# Patient Record
Sex: Female | Born: 1950
Health system: Southern US, Community
[De-identification: ages and names within clinical notes are randomized; demographics above are authoritative.]

## PROBLEM LIST (undated history)

## (undated) DIAGNOSIS — M8580 Other specified disorders of bone density and structure, unspecified site: Secondary | ICD-10-CM

## (undated) DIAGNOSIS — D649 Anemia, unspecified: Secondary | ICD-10-CM

## (undated) DIAGNOSIS — G4730 Sleep apnea, unspecified: Secondary | ICD-10-CM

## (undated) DIAGNOSIS — M5416 Radiculopathy, lumbar region: Secondary | ICD-10-CM

## (undated) DIAGNOSIS — E669 Obesity, unspecified: Secondary | ICD-10-CM

## (undated) DIAGNOSIS — E7800 Pure hypercholesterolemia, unspecified: Secondary | ICD-10-CM

## (undated) DIAGNOSIS — J189 Pneumonia, unspecified organism: Secondary | ICD-10-CM

## (undated) DIAGNOSIS — T8859XA Other complications of anesthesia, initial encounter: Secondary | ICD-10-CM

## (undated) DIAGNOSIS — K3189 Other diseases of stomach and duodenum: Secondary | ICD-10-CM

## (undated) DIAGNOSIS — C801 Malignant (primary) neoplasm, unspecified: Secondary | ICD-10-CM

## (undated) DIAGNOSIS — E618 Deficiency of other specified nutrient elements: Secondary | ICD-10-CM

## (undated) DIAGNOSIS — G4700 Insomnia, unspecified: Secondary | ICD-10-CM

## (undated) DIAGNOSIS — E039 Hypothyroidism, unspecified: Secondary | ICD-10-CM

## (undated) DIAGNOSIS — R5383 Other fatigue: Secondary | ICD-10-CM

## (undated) DIAGNOSIS — J069 Acute upper respiratory infection, unspecified: Secondary | ICD-10-CM

## (undated) DIAGNOSIS — B3782 Candidal enteritis: Secondary | ICD-10-CM

## (undated) DIAGNOSIS — J40 Bronchitis, not specified as acute or chronic: Secondary | ICD-10-CM

## (undated) DIAGNOSIS — K219 Gastro-esophageal reflux disease without esophagitis: Secondary | ICD-10-CM

## (undated) DIAGNOSIS — T4145XA Adverse effect of unspecified anesthetic, initial encounter: Secondary | ICD-10-CM

## (undated) HISTORY — DX: Other specified disorders of bone density and structure, unspecified site: M85.80

## (undated) HISTORY — PX: OTHER SURGICAL HISTORY: SHX169

## (undated) HISTORY — DX: Other complications of anesthesia, initial encounter: T88.59XA

## (undated) HISTORY — DX: Malignant (primary) neoplasm, unspecified: C80.1

## (undated) HISTORY — PX: COLONOSCOPY: SHX174

## (undated) HISTORY — PX: LUNG BIOPSY: SHX232

## (undated) HISTORY — PX: GALLBLADDER SURGERY: SHX652

## (undated) HISTORY — DX: Radiculopathy, lumbar region: M54.16

## (undated) HISTORY — DX: Gastro-esophageal reflux disease without esophagitis: K21.9

## (undated) HISTORY — DX: Hypothyroidism, unspecified: E03.9

## (undated) HISTORY — PX: TONSILLECTOMY: SUR1361

## (undated) HISTORY — DX: Adverse effect of unspecified anesthetic, initial encounter: T41.45XA

## (undated) HISTORY — DX: Insomnia, unspecified: G47.00

## (undated) HISTORY — PX: LAPAROSCOPIC CHOLECYSTECTOMY: SUR755

## (undated) HISTORY — DX: Other diseases of stomach and duodenum: K31.89

## (undated) HISTORY — DX: Bronchitis, not specified as acute or chronic: J40

## (undated) HISTORY — DX: Pure hypercholesterolemia, unspecified: E78.00

## (undated) HISTORY — DX: Obesity, unspecified: E66.9

## (undated) HISTORY — PX: POLYPECTOMY: SHX149

## (undated) HISTORY — DX: Candidal enteritis: B37.82

## (undated) HISTORY — DX: Other fatigue: R53.83

## (undated) HISTORY — DX: Anemia, unspecified: D64.9

## (undated) HISTORY — DX: Deficiency of other specified nutrient elements: E61.8

---

## 2003-02-22 ENCOUNTER — Other Ambulatory Visit: Admission: RE | Admit: 2003-02-22 | Discharge: 2003-02-22 | Payer: Self-pay | Admitting: Internal Medicine

## 2003-02-26 ENCOUNTER — Encounter: Admission: RE | Admit: 2003-02-26 | Discharge: 2003-02-26 | Payer: Self-pay | Admitting: Internal Medicine

## 2003-02-26 ENCOUNTER — Encounter: Payer: Self-pay | Admitting: Internal Medicine

## 2004-01-10 ENCOUNTER — Encounter: Admission: RE | Admit: 2004-01-10 | Discharge: 2004-01-10 | Payer: Self-pay | Admitting: Family Medicine

## 2004-02-02 ENCOUNTER — Observation Stay (HOSPITAL_COMMUNITY): Admission: RE | Admit: 2004-02-02 | Discharge: 2004-02-02 | Payer: Self-pay | Admitting: *Deleted

## 2004-02-02 ENCOUNTER — Encounter (INDEPENDENT_AMBULATORY_CARE_PROVIDER_SITE_OTHER): Payer: Self-pay | Admitting: Specialist

## 2005-09-16 ENCOUNTER — Ambulatory Visit (HOSPITAL_COMMUNITY): Admission: RE | Admit: 2005-09-16 | Discharge: 2005-09-16 | Payer: Self-pay | Admitting: *Deleted

## 2007-06-09 ENCOUNTER — Encounter: Admission: RE | Admit: 2007-06-09 | Discharge: 2007-06-09 | Payer: Self-pay | Admitting: Internal Medicine

## 2010-10-01 ENCOUNTER — Encounter: Payer: Self-pay | Admitting: Internal Medicine

## 2011-01-26 NOTE — Op Note (Signed)
NAME:  April [name], April [name]                            ACCOUNT NO.:  [id number]   MEDICAL RECORD NO.:  [id number]                   PATIENT TYPE:  AMB   LOCATION:  DAY                                  FACILITY:  [facility]   PHYSICIAN:  Vikki Ports, M.D.         DATE OF BIRTH:  08-18-1951   DATE OF PROCEDURE:  02/02/2004  DATE OF DISCHARGE:                                 OPERATIVE REPORT   PREOPERATIVE DIAGNOSIS:  Symptomatic cholelithiasis.   POSTOPERATIVE DIAGNOSIS:  Acute cholecystitis.   SURGERY:  Laparoscopic cholecystectomy.   SURGEON:  Danna Hefty, M.D.   ASSISTANT:  Jaclynn Guarneri, M.D.   ANESTHESIA:  General.   DESCRIPTION OF PROCEDURE:  The patient was taken to the operating room and  placed in a supine position.  After adequate general anesthesia was induced  using endotracheal tube, the abdomen was prepped and draped in the normal  sterile fashion.  Using a transverse infraumbilical incision, I dissected  down to the fascia.  Fascia was opened vertically.  An 0 Vicryl pursestring  suture was placed around the fascial defect.  Pneumoperitoneum was obtained  after Hasson trocar was placed in the abdomen.  Under direct visualization,  a 10 mm port was placed in the subxiphoid region; two 5 mm ports were placed  in the right abdomen.  Gallbladder was identified, was very tense, and had  significant edema and areas of patchy necrosis.  It was aspirated and then  retracted cephalad.  Dissecting down at the infundibulum near the neck, the  cystic duct was identified.  Good window was created posterior to it.  The  cystic duct was triply clipped and divided.  The cystic artery was  identified, dissected in a similar fashion, triply clipped and divided.  Gallbladder was taken off the gallbladder bed using Bovie electrocautery.  Of note, there was a very significant amount of inflammation and no good  plane.  Surgicel was placed in the gallbladder fossa, and the  gallbladder  was placed in an EndoCatch bag and removed through the umbilical port.  Adequate hemostasis was assured.  Incisions were injected with Marcaine.  The infraumbilical fascial defect was closed with the 0 Vicryl pursestring  suture.  Skin incisions were closed with subcuticular 4-0 Monocryl.  Steri-  Strips and sterile dressings were applied.  The patient tolerated the  procedure well and went to PACU in good condition.                                               Vikki Ports, M.D.    KRH/MEDQ  D:  02/02/2004  T:  02/02/2004  Job:  [id number]

## 2011-08-16 ENCOUNTER — Ambulatory Visit (INDEPENDENT_AMBULATORY_CARE_PROVIDER_SITE_OTHER): Payer: BC Managed Care – PPO

## 2011-08-16 DIAGNOSIS — R0989 Other specified symptoms and signs involving the circulatory and respiratory systems: Secondary | ICD-10-CM

## 2011-08-16 DIAGNOSIS — J40 Bronchitis, not specified as acute or chronic: Secondary | ICD-10-CM

## 2011-08-16 DIAGNOSIS — R05 Cough: Secondary | ICD-10-CM

## 2011-08-21 ENCOUNTER — Other Ambulatory Visit: Payer: Self-pay | Admitting: Internal Medicine

## 2011-08-21 ENCOUNTER — Ambulatory Visit
Admission: RE | Admit: 2011-08-21 | Discharge: 2011-08-21 | Disposition: A | Discharge status: Home / Self Care | Payer: BC Managed Care – PPO | Source: Ambulatory Visit | Attending: Internal Medicine | Admitting: Internal Medicine

## 2011-08-21 DIAGNOSIS — IMO0002 Reserved for concepts with insufficient information to code with codable children: Secondary | ICD-10-CM

## 2011-08-23 ENCOUNTER — Encounter: Payer: Self-pay | Admitting: *Deleted

## 2011-08-24 ENCOUNTER — Encounter: Payer: Self-pay | Admitting: Cardiovascular Disease

## 2011-08-24 ENCOUNTER — Ambulatory Visit (INDEPENDENT_AMBULATORY_CARE_PROVIDER_SITE_OTHER): Payer: BC Managed Care – PPO | Admitting: Cardiovascular Disease

## 2011-08-24 VITALS — BP 103/64 | HR 67 | Wt 181.0 lb

## 2011-08-24 DIAGNOSIS — J9859 Other diseases of mediastinum, not elsewhere classified: Secondary | ICD-10-CM | POA: Insufficient documentation

## 2011-08-24 DIAGNOSIS — R222 Localized swelling, mass and lump, trunk: Secondary | ICD-10-CM

## 2011-08-24 DIAGNOSIS — J069 Acute upper respiratory infection, unspecified: Secondary | ICD-10-CM | POA: Insufficient documentation

## 2011-08-24 DIAGNOSIS — I5189 Other ill-defined heart diseases: Secondary | ICD-10-CM

## 2011-08-24 DIAGNOSIS — E039 Hypothyroidism, unspecified: Secondary | ICD-10-CM

## 2011-08-24 DIAGNOSIS — I517 Cardiomegaly: Secondary | ICD-10-CM

## 2011-08-24 NOTE — Progress Notes (Signed)
60 yo referred by Dr Alessandra Bevels for cardiomegaly.  Seen at [facility] recently for URI/bronchitis  CXR showed ? Cardiomegaly.  I reviewed the actual images on Efilm and reviewed them with Dr Richarda Overlie from [facility] radiology.  Differential is wide but includes pericardial cyst, thymoma/lipoma, primary RML lung lesion, coronary aneurysm.  She is essentially asymptomtic.  Lives with significant life partner who is healthy.  No recent fever, sputum or hemoptysis.  No recent trauma.  Patient cannot recall recent CXR.  She denies SSCP or cardiac history.  On thyroid replacement but no history of large goiter.  CRF;s include elevated lipids on statin RX.    ROS: Denies fever, malais, weight loss, blurry vision, decreased visual acuity, cough, sputum, SOB, hemoptysis, pleuritic pain, palpitaitons, heartburn, abdominal pain, melena, lower extremity edema, claudication, or rash.  All other systems reviewed and negative   General: Affect appropriate Healthy:  appears stated age HEENT: normal Neck supple with no adenopathy JVP normal no bruits no thyromegaly Lungs clear with no wheezing and good diaphragmatic motion Heart:  S1/S2 no murmur,rub, gallop or click PMI normal Abdomen: benighn, BS positve, no tenderness, no AAA no bruit.  No HSM or HJR Distal pulses intact with no bruits No edema Neuro non-focal Skin warm and dry No muscular weakness  Medications Current Outpatient Prescriptions  Medication Sig Dispense Refill  . Ascorbic Acid (VITAMIN C) 1000 MG tablet Take 1,000 mg by mouth daily.        . benzonatate (TESSALON) 100 MG capsule Take 100 mg by mouth 3 (three) times daily as needed.        Marland Kitchen BETAINE PO Take 1 tablet by mouth daily.        . Cholecalciferol (VITAMIN D3) 5000 UNITS CAPS Take 1 tablet by mouth daily.        . IODINE, KELP, PO Take 1 tablet by mouth 2 (two) times daily.        Marland Kitchen liothyronine (CYTOMEL) 5 MCG tablet 2 tabs po am and 1 tab po qhs       . MILK  THISTLE PO Take 1 tablet by mouth daily.        . Multiple Vitamins-Minerals (ZINC PO) Take 1 tablet by mouth daily.        . NON FORMULARY Ultra nutrient 2-3 pills qd       . NON FORMULARY AnnattoTocotrienols 1 tab po qhs       . NON FORMULARY Primary Digest Pills 1 tab with each meal       . pravastatin (PRAVACHOL) 40 MG tablet Take 40 mg by mouth daily.        . Selenium 200 MCG CAPS Take 1 capsule by mouth daily.        . Taurine 1000 MG CAPS Take 2 capsules by mouth daily.        Marland Kitchen thyroid (ARMOUR) 60 MG tablet 1 1/2 tab po qd       . vitamin A 16109 UNIT capsule Take 25,000 Units by mouth daily.        . NON FORMULARY Eye drops daily       . PHOSPHATIDYLCHOLINE PO Take 1 tablet by mouth daily.          Allergies NKDA.  Family History: Negative for premature CAD  Social History: History   Social History  . Marital Status: Single    Spouse Name: N/A    Number of Children: N/A  . Years of Education:  N/A   Lives with significant life partner.  Exercises regularly including "intense" weights 2x/week Nonsmoker nondrinker    Electrocardiogram:  NSR normal ECG done at urgent care CXR:  Right paracardiac mass.  Anterior on lateral view.  Differential includes pericardial cyst, thymoma/lipoma primary RML lung lesion  Assessment and Plan

## 2011-08-24 NOTE — Assessment & Plan Note (Signed)
Continue replacement Rx  TSH/Free T4 with Dr Alessandra Bevels

## 2011-08-24 NOTE — Patient Instructions (Signed)
Your physician has requested that you have an echocardiogram. Echocardiography is a painless test that uses sound waves to create images of your heart. It provides your doctor with information about the size and shape of your heart and how well your heart's chambers and valves are working. This procedure takes approximately one hour. There are no restrictions for this procedure.   

## 2011-08-24 NOTE — Assessment & Plan Note (Signed)
Resolved.  Normal exam today.  CXR finding not likely related to initial clincial presentation

## 2011-08-24 NOTE — Assessment & Plan Note (Signed)
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  Given patients overall general excellent health would be unlikely to be pathologic finding.  Start with echo.  However I suspect she will need chest CT with contrast or MRI for larger field of view.

## 2011-08-27 ENCOUNTER — Ambulatory Visit (HOSPITAL_COMMUNITY): Payer: BC Managed Care – PPO | Attending: Cardiovascular Disease | Admitting: Radiology

## 2011-08-27 DIAGNOSIS — I079 Rheumatic tricuspid valve disease, unspecified: Secondary | ICD-10-CM | POA: Insufficient documentation

## 2011-08-27 DIAGNOSIS — I517 Cardiomegaly: Secondary | ICD-10-CM | POA: Insufficient documentation

## 2011-08-27 DIAGNOSIS — R222 Localized swelling, mass and lump, trunk: Secondary | ICD-10-CM | POA: Insufficient documentation

## 2011-08-28 ENCOUNTER — Telehealth: Payer: Self-pay | Admitting: Cardiovascular Disease

## 2011-08-28 DIAGNOSIS — I318 Other specified diseases of pericardium: Secondary | ICD-10-CM

## 2011-08-28 NOTE — Telephone Encounter (Signed)
Pt calling re echo results. °

## 2011-08-28 NOTE — Telephone Encounter (Signed)
PT AWARE  OF ECHO RESULTS PER DR [facility] NEEDS CARDIAC CT  R/O PERICARDIAL  MASS VERSES CYST./CY

## 2011-08-31 ENCOUNTER — Ambulatory Visit (INDEPENDENT_AMBULATORY_CARE_PROVIDER_SITE_OTHER)
Admission: RE | Admit: 2011-08-31 | Discharge: 2011-08-31 | Disposition: A | Discharge status: Home / Self Care | Payer: BC Managed Care – PPO | Source: Ambulatory Visit | Attending: Cardiovascular Disease | Admitting: Cardiovascular Disease

## 2011-08-31 ENCOUNTER — Other Ambulatory Visit: Payer: Self-pay | Admitting: *Deleted

## 2011-08-31 DIAGNOSIS — R222 Localized swelling, mass and lump, trunk: Secondary | ICD-10-CM

## 2011-08-31 MED ORDER — IOHEXOL 300 MG/ML  SOLN
80.0000 mL | Freq: Once | INTRAMUSCULAR | Status: AC | PRN
  Administered 2011-08-31: 80 mL via INTRAVENOUS

## 2011-08-31 NOTE — Telephone Encounter (Signed)
Addended by: Scherrie Bateman E on: 08/31/2011 03:08 PM   Modules accepted: Orders

## 2011-08-31 NOTE — Telephone Encounter (Signed)
PT NEEDS CHEST CT NOT CARDIAC CT  ORDER ENTERED./CY

## 2011-09-13 ENCOUNTER — Telehealth: Payer: Self-pay | Admitting: Cardiovascular Disease

## 2011-09-13 ENCOUNTER — Ambulatory Visit (INDEPENDENT_AMBULATORY_CARE_PROVIDER_SITE_OTHER): Payer: BC Managed Care – PPO | Admitting: *Deleted

## 2011-09-13 DIAGNOSIS — Z01810 Encounter for preprocedural cardiovascular examination: Secondary | ICD-10-CM

## 2011-09-13 DIAGNOSIS — R222 Localized swelling, mass and lump, trunk: Secondary | ICD-10-CM

## 2011-09-13 LAB — BASIC METABOLIC PANEL
Calcium: 9.5 mg/dL (ref 8.4–10.5)
Creatinine, Ser: 0.8 mg/dL (ref 0.4–1.2)
GFR: 79.5 mL/min (ref >60.00)
Sodium: 140 mEq/L (ref 135–145)

## 2011-09-13 NOTE — Telephone Encounter (Signed)
FU Call: Pt returning call from Christine. Please return pt call to discuss further.  

## 2011-09-13 NOTE — Telephone Encounter (Signed)
Pt would like to know if Dr. Eden Emms has gotten the x-rays she has requested to be sent to him it should be two of them the back and the side

## 2011-09-13 NOTE — Telephone Encounter (Signed)
PT AWARE  WILL SCHEDULE  CHEST MRI  WITH DR DAN ENTRIKEN WILL HAVE BMET DONE TODAY ./CY

## 2011-09-14 NOTE — Telephone Encounter (Signed)
PT AWARE DID NOT FIND  CD OF CXR FROM 2-12 OR 11-12 the patient  TO REQUEST ANOTHER COPY FROM  OTHER FACILITY PER DR NISHAN'S REQUEST .Zack Seal

## 2011-09-17 ENCOUNTER — Other Ambulatory Visit: Payer: Self-pay | Admitting: Cardiovascular Disease

## 2011-09-17 ENCOUNTER — Telehealth: Payer: Self-pay | Admitting: Cardiovascular Disease

## 2011-09-17 ENCOUNTER — Telehealth: Payer: Self-pay | Admitting: *Deleted

## 2011-09-17 DIAGNOSIS — J9859 Other diseases of mediastinum, not elsewhere classified: Secondary | ICD-10-CM

## 2011-09-17 NOTE — Telephone Encounter (Signed)
Fu call Patient returning your call please call her cell back

## 2011-09-17 NOTE — Telephone Encounter (Signed)
LEFT MESSAGE FOR PT TO CALL BACK  RE  MRI/ MRA SCHEDULED FOR 09-19-10 AT 10:00  AM PT TO ARRIVE IN RADIOLOGY AT  9:45

## 2011-09-17 NOTE — Telephone Encounter (Signed)
PT AWARE OF  TIME OF MRI   AND DATE .April [name]

## 2011-09-17 NOTE — Telephone Encounter (Signed)
Walk in Pt Form " Pt Dropped off CD of Xrays from [facility] for Nishan to review" sent to [facility]  09/17/11/KM

## 2011-09-20 ENCOUNTER — Other Ambulatory Visit (HOSPITAL_COMMUNITY): Payer: Self-pay | Admitting: Interventional Radiology

## 2011-09-20 ENCOUNTER — Other Ambulatory Visit (HOSPITAL_COMMUNITY): Payer: BC Managed Care – PPO

## 2011-09-20 ENCOUNTER — Telehealth: Payer: Self-pay | Admitting: Cardiovascular Disease

## 2011-09-20 DIAGNOSIS — J9859 Other diseases of mediastinum, not elsewhere classified: Secondary | ICD-10-CM

## 2011-09-20 NOTE — Telephone Encounter (Signed)
FU Call: pt calling with question about biopsy. Please return pt call to discuss further.

## 2011-09-20 NOTE — Telephone Encounter (Signed)
Patient called, she wants to know in advance if it will be something that could keep her from going to [facility] for an event after a biopsy of a tumor  in her chest that will be done  a week after next Friday.  Patient was made aware that she can consult with the MD that is performing the biopsy, and that it is  difficult to predict what would happened prior the procedure. Patient states " I don't want your opinion,I want Dr. Fabio Bering " I need to know in advance what could happened, so my family and I can reschedule the event for later.

## 2011-09-20 NOTE — Telephone Encounter (Signed)
Left a message to call back.

## 2011-09-20 NOTE — Telephone Encounter (Signed)
New Problem  Patient would like a return call regarding her care.  Please return call to Cell

## 2011-09-21 ENCOUNTER — Encounter (HOSPITAL_COMMUNITY): Payer: Self-pay

## 2011-09-21 ENCOUNTER — Other Ambulatory Visit: Payer: Self-pay | Admitting: Radiology

## 2011-09-24 NOTE — Telephone Encounter (Signed)
DR Eden Emms SPOKE TO PT./CY

## 2011-09-26 ENCOUNTER — Ambulatory Visit
Admission: RE | Admit: 2011-09-26 | Discharge: 2011-09-26 | Disposition: A | Discharge status: Home / Self Care | Payer: BC Managed Care – PPO | Source: Ambulatory Visit | Attending: Cardiovascular Disease | Admitting: Cardiovascular Disease

## 2011-09-26 DIAGNOSIS — J9859 Other diseases of mediastinum, not elsewhere classified: Secondary | ICD-10-CM

## 2011-09-27 ENCOUNTER — Other Ambulatory Visit: Payer: Self-pay | Admitting: Radiology

## 2011-09-28 ENCOUNTER — Ambulatory Visit (HOSPITAL_COMMUNITY)
Admission: RE | Admit: 2011-09-28 | Discharge: 2011-09-28 | Disposition: A | Discharge status: Home / Self Care | Payer: BC Managed Care – PPO | Source: Ambulatory Visit | Attending: Interventional Radiology | Admitting: Interventional Radiology

## 2011-09-28 ENCOUNTER — Encounter (HOSPITAL_COMMUNITY): Payer: Self-pay

## 2011-09-28 DIAGNOSIS — R222 Localized swelling, mass and lump, trunk: Secondary | ICD-10-CM | POA: Insufficient documentation

## 2011-09-28 DIAGNOSIS — R05 Cough: Secondary | ICD-10-CM | POA: Insufficient documentation

## 2011-09-28 DIAGNOSIS — Z79899 Other long term (current) drug therapy: Secondary | ICD-10-CM | POA: Insufficient documentation

## 2011-09-28 DIAGNOSIS — R059 Cough, unspecified: Secondary | ICD-10-CM | POA: Insufficient documentation

## 2011-09-28 DIAGNOSIS — E039 Hypothyroidism, unspecified: Secondary | ICD-10-CM | POA: Insufficient documentation

## 2011-09-28 DIAGNOSIS — J3489 Other specified disorders of nose and nasal sinuses: Secondary | ICD-10-CM | POA: Insufficient documentation

## 2011-09-28 DIAGNOSIS — J9859 Other diseases of mediastinum, not elsewhere classified: Secondary | ICD-10-CM

## 2011-09-28 DIAGNOSIS — D4989 Neoplasm of unspecified behavior of other specified sites: Secondary | ICD-10-CM | POA: Insufficient documentation

## 2011-09-28 DIAGNOSIS — E7800 Pure hypercholesterolemia, unspecified: Secondary | ICD-10-CM | POA: Insufficient documentation

## 2011-09-28 LAB — PROTIME-INR
INR: 1.04 (ref 0.00–1.49)
Prothrombin Time: 13.8 seconds (ref 11.6–15.2)

## 2011-09-28 LAB — CBC
HCT: 38.8 % (ref 36.0–46.0)
MCHC: 34.5 g/dL (ref 30.0–36.0)
RDW: 13.4 % (ref 11.5–15.5)

## 2011-09-28 LAB — APTT: aPTT: 36 seconds (ref 24–37)

## 2011-09-28 MED ORDER — FENTANYL CITRATE 0.05 MG/ML IJ SOLN
INTRAMUSCULAR | Status: AC | PRN
  Administered 2011-09-28: 50 ug via INTRAVENOUS

## 2011-09-28 MED ORDER — SODIUM CHLORIDE 0.9 % IV SOLN
INTRAVENOUS | Status: DC
  Administered 2011-09-28: 500 mL via INTRAVENOUS

## 2011-09-28 MED ORDER — MIDAZOLAM HCL 5 MG/5ML IJ SOLN
INTRAMUSCULAR | Status: AC | PRN
  Administered 2011-09-28: 1 mg via INTRAVENOUS

## 2011-09-28 NOTE — H&P (Signed)
Patient seen and examined on 09/27/11 in office.  CT guided biopsy of mediastinal mass today.

## 2011-09-28 NOTE — Procedures (Signed)
Procedure:  CT guided biopsy of mediastinal mass Findings:  18G core biopsy x 4 via 17G needle of right anterior mediastinal mass. No complications

## 2011-09-28 NOTE — H&P (Signed)
April [name] is an 61 y.o. female.   Chief Complaint: "I'm here for a biopsy" HPI: Patient with history of upper respiratory tract infection and recent imaging studies revealing a 6.5 cm solid anterior mediastinal mass; she presents today for CT guided biopsy of the mediastinal mass.  Past Medical History  Diagnosis Date  . Obesity   . Hypothyroidism   . Insomnia   . Mineral deficiency   . Hypochlorhydria   . Hypercholesteremia   . Cataract   . Vitamin D deficiency   . Cardiomegaly   . Fatigue   . Candidiasis of intestine   . Vitamin D deficiency   . Radiculopathy, lumbar region   . Bronchitis     Past Surgical History  Procedure Date  . Laparoscopic cholecystectomy   . Gallbladder surgery   . Tonsillectomy     History reviewed. No pertinent family history. Social History:  reports that she quit smoking about 28 years ago. Her smoking use included Cigarettes. She has a 17 pack-year smoking history. She does not have any smokeless tobacco history on file. She reports that she does not drink alcohol or use illicit drugs.  Allergies: No Known Allergies  Medications Prior to Admission  Medication Sig Dispense Refill  . Acetylcysteine (N-ACETYL-L-CYSTEINE PO) Take 1 tablet by mouth daily.      . Ascorbic Acid (VITAMIN C) 1000 MG tablet Take 1,000 mg by mouth daily.        Marland Kitchen BETAINE PO Take 3 tablets by mouth 3 (three) times daily with meals.       . Cholecalciferol (VITAMIN D3) 5000 UNITS CAPS Take 1 tablet by mouth daily.       . Coenzyme Q10 (COQ10 PO) Take 1 capsule by mouth daily.      . Iodine Strong, Lugols, (IODINE STRONG PO) Take 2 capsules by mouth 2 (two) times daily.      Marland Kitchen MILK THISTLE PO Take 1 capsule by mouth daily.       . Multiple Vitamins-Minerals (ZINC PO) Take 1 capsule by mouth daily.       . NON FORMULARY Take 2 capsules by mouth 3 (three) times daily with meals. Ultra nutrient supplement       . NON FORMULARY Take 1 capsule by mouth every evening.  Annatto Tocotrienols Supplement        . NON FORMULARY Take 1 tablet by mouth 3 (three) times daily with meals. Primary Digest Supplement.       Marland Kitchen OVER THE COUNTER MEDICATION Take 1 capsule by mouth 3 (three) times daily with meals. Digestion GB.      Marland Kitchen OVER THE COUNTER MEDICATION Take 1 tablet by mouth 2 (two) times daily. hemagenics iron supplement.      . pravastatin (PRAVACHOL) 40 MG tablet Take 40 mg by mouth every evening.       . Selenium 200 MCG CAPS Take 1 capsule by mouth daily.       . Taurine 1000 MG CAPS Take 1 capsule by mouth daily.       . vitamin A 16109 UNIT capsule Take 25,000 Units by mouth daily.       Marland Kitchen liothyronine (CYTOMEL) 5 MCG tablet Take 5-10 mcg by mouth 2 (two) times daily. 2 tablets every morning and 1 tablet in the afternoon.      . thyroid (ARMOUR) 60 MG tablet Take 90 mg by mouth daily before breakfast.        Medications Prior to Admission  Medication  Dose Route Frequency Provider Last Rate Last Dose  . 0.9 %  sodium chloride infusion   Intravenous Continuous Robet Leu, PA 20 mL/hr at 09/28/11 0730 500 mL at 09/28/11 0730    Results for orders placed during the hospital encounter of 09/28/11 (from the past 48 hour(s))  APTT     Status: Normal   Collection Time   09/28/11  7:35 AM      Component Value Range Comment   aPTT 36  24 - 37 (seconds)   CBC     Status: Normal   Collection Time   09/28/11  7:35 AM      Component Value Range Comment   WBC 6.3  4.0 - 10.5 (K/uL)    RBC 4.71  3.87 - 5.11 (MIL/uL)    Hemoglobin 13.4  12.0 - 15.0 (g/dL)    HCT 16.1  09.6 - 04.5 (%)    MCV 82.4  78.0 - 100.0 (fL)    MCH 28.5  26.0 - 34.0 (pg)    MCHC 34.5  30.0 - 36.0 (g/dL)    RDW 40.9  81.1 - 91.4 (%)    Platelets 202  150 - 400 (K/uL)   PROTIME-INR     Status: Normal   Collection Time   09/28/11  7:35 AM      Component Value Range Comment   Prothrombin Time 13.8  11.6 - 15.2 (seconds)    INR 1.04  0.00 - 1.49     Ir Radiologist Eval &  Mgmt  09/26/2011  *RADIOLOGY REPORT*  NEW PATIENT OFFICE VISIT - LEVEL II ([id number])  Chief Complaint:  Anterior mediastinal mass.  History:  April [name] has been referred by Dr. Maurine Cane for a CT guided biopsy of a recently discovered anterior mediastinal mass. This has been scheduled to be performed under CT guidance at [facility] on 09/27/2010.  Dr. Eden Emms and April [name] requested an office visit beforehand to discuss the biopsy procedure.  The patient is a 60-year female who had a chest x-ray in December through an [facility] for evaluation of an upper respiratory tract infection.  This revealed a mediastinal contour abnormality which was further assessed by CT of the chest on 08/31/2011.  This demonstrated a 6.5 cm solid anterior mediastinal mass to the right of midline.  No other associated masses or adenopathy identified in the chest.  There was note made of a small hypervascular nodular lesion in the visualized posterior right lobe of the liver.  The patient's primary care physician is Dr. Mia Creek.  The patient has had a recent upper respiratory cold with some cough and congestion for which she is taking a cough suppressent and over- the-counter medicine.  She denies any chest pain.  She does not have a history of malignancy.  She has had no thoracic surgical procedures.  Assessment and Plan:  I met with April [name] and her partner April [name] in a strictly consultative/counseling role.  We reviewed CT findings and discussed details of percutaneous biopsy including technical details and risks.  The mass is approachable from a right parasternal approach without having to traverse lung tissue.  April [name] had appropriate questions regarding the procedure as well as potential diagnoses.  All questions were answered.  The procedure is performed under IV conscious sedation.  Typical recovery is 3 hours of bedrest after the procedure.  April [name] would like to proceed with the  procedure.  Original Report Authenticated By: Reola Calkins,  M.D.    Review of Systems  Constitutional: Negative for fever and chills.  HENT: Positive for congestion.   Respiratory: Positive for cough. Negative for shortness of breath.   Cardiovascular: Negative for chest pain.  Gastrointestinal: Negative for nausea, vomiting and abdominal pain.  Neurological: Negative for headaches.  Endo/Heme/Allergies: Does not bruise/bleed easily.    Blood pressure 114/79, pulse 81, temperature 97.5 F (36.4 C), temperature source Oral, resp. rate 18, height 5\' 4"  (1.626 m), weight 180 lb (81.647 kg), SpO2 95.00%. Physical Exam  Constitutional: She is oriented to person, place, and time. She appears well-developed and well-nourished.  Cardiovascular: Normal rate and regular rhythm.   Respiratory: Effort normal.       Few scattered rhonchi  GI: Soft. Bowel sounds are normal. There is no tenderness.  Musculoskeletal: Normal range of motion.  Neurological: She is alert and oriented to person, place, and time.     Assessment/Plan Patient with 6.5 cm anterior mediastinal mass; plan is for CT guided biopsy.  ALLRED,D KEVIN 09/28/2011, 9:04 AM

## 2011-10-03 ENCOUNTER — Telehealth: Payer: Self-pay | Admitting: Cardiovascular Disease

## 2011-10-03 NOTE — Telephone Encounter (Signed)
PT STATED NEEDS ANOTHER REFERRAL  PER RADIOLOGIST, BASED ON BIOPSY REPORT . PT WOULD LIKE TO TALK TO YOU NO LATER THAN TOMORROW   IS GOING OUT OF TOWN  CELL NUMBER IS [phone] PT AWARE  WILL FORWARD TO DR NISHAN./CY

## 2011-10-03 NOTE — Telephone Encounter (Signed)
Pt had biopsy on Friday and she got report and she wants to talk about ref to another provider as a results of the results

## 2011-10-04 NOTE — Telephone Encounter (Signed)
Ed,  This his her cell number.  Hopefully you can see her on 2/7

## 2011-10-15 ENCOUNTER — Other Ambulatory Visit: Payer: Self-pay | Admitting: *Deleted

## 2011-10-15 DIAGNOSIS — I5189 Other ill-defined heart diseases: Secondary | ICD-10-CM

## 2011-10-15 NOTE — Telephone Encounter (Signed)
Entered referral to Dr. Tyrone Sage.

## 2011-10-15 NOTE — Telephone Encounter (Signed)
New Msg: Pt calling wanting referral to [facility], Dr. Tyrone Sage. Please return pt call to discuss further.

## 2011-10-16 ENCOUNTER — Other Ambulatory Visit: Payer: Self-pay | Admitting: Cardiothoracic Surgery

## 2011-10-16 DIAGNOSIS — D381 Neoplasm of uncertain behavior of trachea, bronchus and lung: Secondary | ICD-10-CM

## 2011-10-19 ENCOUNTER — Encounter: Payer: BC Managed Care – PPO | Admitting: Cardiothoracic Surgery

## 2011-10-19 ENCOUNTER — Ambulatory Visit (HOSPITAL_COMMUNITY)
Admission: RE | Admit: 2011-10-19 | Discharge: 2011-10-19 | Disposition: A | Discharge status: Home / Self Care | Payer: BC Managed Care – PPO | Source: Ambulatory Visit | Attending: Cardiothoracic Surgery | Admitting: Cardiothoracic Surgery

## 2011-10-19 ENCOUNTER — Institutional Professional Consult (permissible substitution) (INDEPENDENT_AMBULATORY_CARE_PROVIDER_SITE_OTHER): Payer: BC Managed Care – PPO | Admitting: Cardiothoracic Surgery

## 2011-10-19 ENCOUNTER — Encounter: Payer: Self-pay | Admitting: Cardiothoracic Surgery

## 2011-10-19 VITALS — BP 129/79 | HR 67 | Resp 20 | Ht 64.0 in | Wt 178.0 lb

## 2011-10-19 DIAGNOSIS — R222 Localized swelling, mass and lump, trunk: Secondary | ICD-10-CM

## 2011-10-19 DIAGNOSIS — D381 Neoplasm of uncertain behavior of trachea, bronchus and lung: Secondary | ICD-10-CM | POA: Insufficient documentation

## 2011-10-21 NOTE — Progress Notes (Signed)
[address]            [place] [address]          [phone]      April [name] [facility] Medical Record #[id number] Date of Birth: [birth year]  Referring: Wendall Stade, MD Primary Care: Mia Creek, MD, MD  Chief Complaint:    Chief Complaint  Patient presents with  . Mediastinal Mass    Referral from Dr Eden Emms for surgical eval on Mediastinal mass    History of Present Illness:     61 yo female with not history of neurological problems who is found on chest xray to mediastinal mass. CT done in Dec 2012 confirmed anterior mediastinal mass. Needle bx has been done and patient referred for surgical consult. She has no symptoms of muscle weakness.    Current Activity/ Functional Status: Patient Patient is independent with mobility/ambulation, transfers, ADL's, IADL's. independent with mobility/ambulation, transfers, ADL's, IADL's.   Past Medical History  Diagnosis Date  . Obesity   . Hypothyroidism   . Insomnia   . Mineral deficiency   . Hypochlorhydria   . Hypercholesteremia   . Cataract   . Vitamin d deficiency   . Cardiomegaly   . Fatigue   . Candidiasis of intestine   . Vitamin d deficiency   . Radiculopathy, lumbar region   . Bronchitis     Past Surgical History  Procedure Date  . Laparoscopic cholecystectomy   . Gallbladder surgery   . Tonsillectomy     Family History: no family history of mediastinal mass  History   Social History  . Marital Status: [name] partner April [name]     Spouse Name: N/A    Number of Children: N/A   . Years of Education: N/A   Occupational History  . Not on file.   Social History Main Topics  . Smoking status: Former Smoker -- 1.0 packs/day for 17 years    Types: Cigarettes    Quit date: 09/26/1983  . Smokeless tobacco: Not on file  . Alcohol Use: No  . Drug Use: No  . Sexually Active: Not on file   Other Topics Concern  . Not on file   Social History  Narrative  . No narrative on file    History  Smoking status  . Former Smoker -- 1.0 packs/day for 17 years  . Types: Cigarettes  . Quit date: 09/26/1983  Smokeless tobacco  . Not on file    History  Alcohol Use No     No Known Allergies  Current Outpatient Prescriptions  Medication Sig Dispense Refill  . Acetylcysteine (N-ACETYL-L-CYSTEINE PO) Take 1 tablet by mouth daily.      . Ascorbic Acid (VITAMIN C) 1000 MG tablet Take 1,000 mg by mouth daily.        Marland Kitchen BETAINE PO Take 3 tablets by mouth 3 (three) times daily with meals.       . Cholecalciferol (VITAMIN D3) 5000 UNITS CAPS Take 1 tablet by mouth daily.       . Coenzyme Q10 (COQ10 PO) Take 1 capsule by mouth daily.      . Iodine Strong, Lugols, (IODINE STRONG PO) Take 2 capsules by mouth 2 (two) times daily.      Marland Kitchen liothyronine (CYTOMEL) 5 MCG tablet Take 5-10 mcg by mouth 2 (two) times daily. 2 tablets every morning and 1 tablet  in the afternoon.      Marland Kitchen MILK THISTLE PO Take 1 capsule by mouth daily.       . Multiple Vitamins-Minerals (ZINC PO) Take 1 capsule by mouth daily.       . NON FORMULARY Take 2 capsules by mouth 3 (three) times daily with meals. Ultra nutrient supplement       . NON FORMULARY Take 1 capsule by mouth every evening. Annatto Tocotrienols Supplement        . NON FORMULARY Take 1 tablet by mouth 3 (three) times daily with meals. Primary Digest Supplement.       Marland Kitchen OVER THE COUNTER MEDICATION Take 1 tablet by mouth 2 (two) times daily. hemagenics iron supplement.      . pravastatin (PRAVACHOL) 40 MG tablet Take 40 mg by mouth every evening.       . Selenium 200 MCG CAPS Take 1 capsule by mouth daily.       . Taurine 1000 MG CAPS Take 1 capsule by mouth daily.       Marland Kitchen thyroid (ARMOUR) 60 MG tablet Take 90 mg by mouth daily before breakfast.       . vitamin A 91478 UNIT capsule Take 25,000 Units by mouth daily.       Marland Kitchen OVER THE COUNTER MEDICATION Take 1 capsule by mouth 3 (three) times daily with  meals. Digestion GB.           Review of Systems:     Cardiac Review of Systems: Y or N  Chest Pain [  n  ]  Resting SOB [ nn  ] Exertional SOB  [ n ]  Orthopnea [  n]   Pedal Edema [  n ]    Palpitations [n  ] Syncope  [n  ]   Presyncope [ n  ]  General Review of Systems: [Y] = yes [  ]=no Consitutional: recent weight change [ y ]; anorexia [ n ]; fatigue [ y ]; nausea [n  ]; night sweats [ n ]; fever [ n ]; or chills [n  ];                                                                                                                                          Dental: poor dentition[n  ];   Eye : blurred vision [  ]; diplopia [   ]; vision changes [  ];  Amaurosis fugax[  ]; Resp: cough [  ];  wheezing[  ];  hemoptysis[  ]; shortness of breath[  ]; paroxysmal nocturnal dyspnea[  ]; dyspnea on exertion[  ]; or orthopnea[  ];  GI:  gallstones[  ], vomiting[  ];  dysphagia[  ]; melena[  ];  hematochezia [  ]; heartburn[  ];   Hx of  Colonoscopy[  ]; GU: kidney stones [  ]; hematuria[  ];  dysuria [  ];  nocturia[  ];  history of     obstruction [  ];             Skin: rash, swelling[  ];, hair loss[  ];  peripheral edema[  ];  or itching[  ]; Musculoskeletal: myalgias[  ];  joint swelling[  ];  joint erythema[  ];  joint pain[  ];  back pain[  ];  Heme/Lymph: bruising[  ];  bleeding[  ];  anemia[  ];  Neuro: TIA[  ];  headaches[  ];  stroke[  ];  vertigo[  ];  seizures[  ];   paresthesias[  ];  difficulty walking[  ];  Psych:depression[  ]; anxiety[  ];  Endocrine: diabetes[  ];  thyroid dysfunction[  ];  Immunizations: Flu Cove.Etienne  ]; Pneumococcal[ n ];  Other:  Physical Exam: BP 129/79  Pulse 67  Resp 20  Ht 5\' 4"  (1.626 m)  Wt 178 lb (80.74 kg)  BMI 30.55 kg/m2  SpO2 97%  General appearance: alert, cooperative, appears stated age and no distress Neurologic: intact Heart: regular rate and rhythm, S1, S2 normal, no murmur, click, rub or gallop Lungs: clear to auscultation  bilaterally Abdomen: soft, non-tender; bowel sounds normal; no masses,  no organomegaly Extremities: extremities normal, atraumatic, no cyanosis or edema and Homans sign is negative, no sign of DVT no cervical adenopathy palapble   Diagnostic Studies & Laboratory data:     Recent Radiology Findings:  Ct Biopsy  09/28/2011  *RADIOLOGY REPORT*  Clinical Data: Right anterior mediastinal mass.  CT GUIDED CORE BIOPSY OF MEDIASTINAL MASS  Sedation:   1.0 mg IV Versed;  50 mcg IV Fentanyl  Total Moderate Sedation Time: 10 minutes.  Procedure:  The procedure risks, benefits, and alternatives were explained to the patient.  Questions regarding the procedure were encouraged and answered.  The patient understands and consents to the procedure.  The right parasternal region was prepped with Betadine in a sterile fashion, and a sterile drape was applied covering the operative field.  A sterile gown and sterile gloves were used for the procedure.  Local anesthesia was provided with 1% Lidocaine.  Initial unenhanced CT was performed in a supine position.  After choosing a site for needle entry, a 17 gauge needle was advanced under CT guidance to the level of the mediastinal mass from a right parasternal approach.  After confirming needle tip positioning, coaxial core biopsy samples were obtained with an 18 gauge needle device. A total of four intact core biopsy specimens were submitted in saline to Pathology.  Complications: None  Findings: Initial imaging again demonstrates a large right-sided anterior mediastinal mass.  Maximal tumor diameter is 7.2 cm. Solid core biopsy samples were obtained from the tumor.  Material was submitted in saline to facilitate any potential need for flow cytometry.  IMPRESSION: CT guided core biopsy performed of the right anterior mediastinal mass.  Original Report Authenticated By: Reola Calkins, M.D.     RADIOLOGY REPORT*  Clinical Data: Abnormal chest x-ray  CT CHEST WITH  CONTRAST  Technique: Multidetector CT imaging of the chest was performed  following the standard protocol during bolus administration of  intravenous contrast. Sagittal and coronal MPR images reconstructed  from axial data set.  Contrast: 80mL OMNIPAQUE IOHEXOL 300 MG/ML IV SOLN  Comparison: None  Findings:  Vascular structures patent on non dedicated exam.  Minimal scattered atherosclerotic calcification aorta.  Large mass identified right lateral aspect of the anterior  mediastinum adjacent to the aorta, SVC and superior margin of right  atrium, measuring 6.5 x 4.7 x 5.9 cm in size. No definite invasion  of the adjacent structures is identified, though not excluded.  On axial images there is a thin rim of peripheral low attenuation  at the antero-right lateral aspect of this lesion which could  represent fluid.  No additional associated thoracic adenopathy.  Small enhancing nodule at posterior aspect right lobe liver 1.4 x  1.3 cm image 59.  Remaining visualized portions of upper abdomen normal appearance.  Lungs clear.  No pleural effusion or pneumothorax.  Subtle lytic lesion within T9 vertebral body, nonspecific, could  potentially represent a small hemangioma.  IMPRESSION:  Large right anterior mediastinal mass superior anterior and lateral  to the right atrium, 6.5 x 4.7 x 5.9 cm in size.  Differential diagnosis includes thymic tumor, lymphoma, complicated  cyst of thymic or pericardial origin, less likely teratoma.  Recommend MR imaging of the chest with/without contrast to  characterize.  Small hypervascular nodule at posterior aspect right lobe liver,  1.4 x 1.3 cm in size.  This too requires follow-up imaging to determine significance; this  is likely too far inferior to be included within field of view at  time of thoracic MR imaging.  Potentially this could represent an atypical hepatic hemangioma;  may evaluate this further by MR imaging or potentially  sonography.   Recent Lab Findings: Lab Results  Component Value Date   WBC 6.3 09/28/2011   HGB 13.4 09/28/2011   HCT 38.8 09/28/2011   PLT 202 09/28/2011   GLUCOSE 108* 09/13/2011   NA 140 09/13/2011   K 5.1 09/13/2011   CL 103 09/13/2011   CREATININE 0.8 09/13/2011   BUN 16 09/13/2011   CO2 30 09/13/2011   INR 1.04 09/28/2011   PFTS completed   Assessment / Plan:    #1 Large Mediastinal Mass anterior consistent with Thymoma  #2 1.4 hypervascular mass rt lobe of liver   I have reviewed diagnosis with patient and partner [facility]. We reviewed CT and path findings findings and discussed details of surgical resection via partial or full sternotomy.Resection has been recommended. The potential need for follow up radiation treatment is also discussed depending on operative findings is also discussed.  The goals risks and alternatives of the planned surgical procedure Resection of mediastinal mass via sternontomy have been discussed with the patient in detail. The risks of the procedure including death, infection, stroke, phrenic nerve injury, myocardial infarction, bleeding, blood transfusion have all been discussed specifically.  I have quoted April [name] a 1 % of perioperative mortality and a complication rate as high as 10 %. The patient's questions have been answered.April [name] is willing  to proceed with the planned procedure.  Plan to proceed Feb 20      Delight Ovens MD  Beeper [phone] Office [phone] 10/19/2011

## 2011-10-22 ENCOUNTER — Other Ambulatory Visit: Payer: Self-pay

## 2011-10-22 DIAGNOSIS — R222 Localized swelling, mass and lump, trunk: Secondary | ICD-10-CM

## 2011-10-27 NOTE — Pre-Procedure Instructions (Signed)
20 April [name]  10/27/2011     Your procedure is scheduled on: Wednesday, Feb. 20th    Report to [facility] at 6:30 AM.   Call this number if you have problems the morning of surgery: [phone]   Remember:   Do not eat food:After Midnight Tuesday.   May have clear liquids: up to 4 Hours before arrival time - 2:30AM.  Clear liquids include soda, tea, black coffee, apple or grape juice, broth.   Take these medicines the morning of surgery with A SIP OF WATER: nothing   Do not wear jewelry, make-up or nail polish.   Do not wear lotions, powders, or perfumes. You may wear deodorant.   Do not shave 48 hours prior to surgery.   Do not bring valuables to the hospital.   Contacts, dentures or bridgework may not be worn into surgery.   Leave suitcase in the car. After surgery it may be brought to your room.  For patients admitted to the hospital, checkout time is 11:00 AM the day of discharge.   Patients discharged the day of surgery will not be allowed to drive home.  Name and phone number of your driver:   [name]  ---  PARTNER  Special Instructions: CHG Shower Use Special Wash: 1/2 bottle night before surgery and 1/2 bottle morning of surgery.   Please read over the following fact sheets that you were given: Pain Booklet, Blood Transfusion Information, MRSA Information and Surgical Site Infection Prevention

## 2011-10-29 ENCOUNTER — Ambulatory Visit (HOSPITAL_COMMUNITY)
Admission: RE | Admit: 2011-10-29 | Discharge: 2011-10-29 | Disposition: A | Discharge status: Home / Self Care | Payer: BC Managed Care – PPO | Source: Ambulatory Visit | Attending: Cardiothoracic Surgery | Admitting: Cardiothoracic Surgery

## 2011-10-29 ENCOUNTER — Encounter (HOSPITAL_COMMUNITY)
Admission: RE | Admit: 2011-10-29 | Discharge: 2011-10-29 | Disposition: A | Discharge status: Home / Self Care | Payer: BC Managed Care – PPO | Source: Ambulatory Visit | Attending: Cardiothoracic Surgery | Admitting: Cardiothoracic Surgery

## 2011-10-29 ENCOUNTER — Other Ambulatory Visit: Payer: Self-pay

## 2011-10-29 ENCOUNTER — Encounter (HOSPITAL_COMMUNITY): Payer: Self-pay

## 2011-10-29 ENCOUNTER — Encounter (HOSPITAL_COMMUNITY): Payer: Self-pay | Admitting: Pharmacy Technician

## 2011-10-29 DIAGNOSIS — Z01818 Encounter for other preprocedural examination: Secondary | ICD-10-CM | POA: Insufficient documentation

## 2011-10-29 DIAGNOSIS — Z01812 Encounter for preprocedural laboratory examination: Secondary | ICD-10-CM | POA: Insufficient documentation

## 2011-10-29 DIAGNOSIS — I517 Cardiomegaly: Secondary | ICD-10-CM | POA: Insufficient documentation

## 2011-10-29 DIAGNOSIS — R222 Localized swelling, mass and lump, trunk: Secondary | ICD-10-CM

## 2011-10-29 HISTORY — DX: Sleep apnea, unspecified: G47.30

## 2011-10-29 HISTORY — DX: Acute upper respiratory infection, unspecified: J06.9

## 2011-10-29 LAB — BLOOD GAS, ARTERIAL
Acid-Base Excess: 2.4 mmol/L — ABNORMAL HIGH (ref 0.0–2.0)
Bicarbonate: 26 mEq/L — ABNORMAL HIGH (ref 20.0–24.0)
Drawn by: 344381
FIO2: 0.21 %
O2 Saturation: 98.2 %
Patient temperature: 98.6
TCO2: 27.1 mmol/L (ref 0–100)
pCO2 arterial: 37.4 mmHg (ref 35.0–45.0)
pH, Arterial: 7.456 — ABNORMAL HIGH (ref 7.350–7.400)
pO2, Arterial: 95 mmHg (ref 80.0–100.0)

## 2011-10-29 LAB — APTT: aPTT: 34 seconds (ref 24–37)

## 2011-10-29 LAB — COMPREHENSIVE METABOLIC PANEL
ALT: 23 U/L (ref 0–35)
AST: 25 U/L (ref 0–37)
Albumin: 3.5 g/dL (ref 3.5–5.2)
Alkaline Phosphatase: 80 U/L (ref 39–117)
BUN: 14 mg/dL (ref 6–23)
CO2: 24 mEq/L (ref 19–32)
Calcium: 9.4 mg/dL (ref 8.4–10.5)
Chloride: 103 mEq/L (ref 96–112)
Creatinine, Ser: 0.6 mg/dL (ref 0.50–1.10)
GFR calc Af Amer: >90 mL/min (ref >90)
GFR calc non Af Amer: >90 mL/min (ref >90)
Glucose, Bld: 105 mg/dL — ABNORMAL HIGH (ref 70–99)
Potassium: 4.2 mEq/L (ref 3.5–5.1)
Sodium: 139 mEq/L (ref 135–145)
Total Bilirubin: 0.1 mg/dL — ABNORMAL LOW (ref 0.3–1.2)
Total Protein: 6.7 g/dL (ref 6.0–8.3)

## 2011-10-29 LAB — ABO/RH: ABO/RH(D): A POS

## 2011-10-29 LAB — CBC
HCT: 37.9 % (ref 36.0–46.0)
Hemoglobin: 12.9 g/dL (ref 12.0–15.0)
MCH: 28.4 pg (ref 26.0–34.0)
MCHC: 34 g/dL (ref 30.0–36.0)
MCV: 83.5 fL (ref 78.0–100.0)
Platelets: 213 10*3/uL (ref 150–400)
RBC: 4.54 MIL/uL (ref 3.87–5.11)
RDW: 13.8 % (ref 11.5–15.5)
WBC: 8.6 10*3/uL (ref 4.0–10.5)

## 2011-10-29 LAB — URINALYSIS, ROUTINE W REFLEX MICROSCOPIC
Bilirubin Urine: NEGATIVE
Glucose, UA: NEGATIVE mg/dL
Hgb urine dipstick: NEGATIVE
Ketones, ur: NEGATIVE mg/dL
Leukocytes, UA: NEGATIVE
Nitrite: NEGATIVE
Protein, ur: NEGATIVE mg/dL
Specific Gravity, Urine: 1.006 (ref 1.005–1.030)
Urobilinogen, UA: 0.2 mg/dL (ref 0.0–1.0)
pH: 7 (ref 5.0–8.0)

## 2011-10-29 LAB — PROTIME-INR
INR: 1.02 (ref 0.00–1.49)
Prothrombin Time: 13.6 seconds (ref 11.6–15.2)

## 2011-10-30 MED ORDER — DEXTROSE 5 % IV SOLN
1.5000 g | INTRAVENOUS | Status: AC
  Administered 2011-10-31: 1.5 g via INTRAVENOUS
  Filled 2011-10-30: qty 1.5

## 2011-10-30 NOTE — Consult Note (Signed)
Anesthesia:  Patient is a 61 year old female scheduled for a thymectomy on 10/31/11.  History includes obesity, hypothyroidism, hypochlorhydria, hypercholesterolemia, cardiomegaly on CXR, cataracts, former smoker, OSA, radiculopathy (lumbar).  PCP is Dr. Mia Creek.  She was initially referred to Dr. Eden Emms on 08/24/11 for an incidental finding of CM on CXR for URI symptoms.  An echo and CT scan were ordered.  Echo on 08/27/11 showed: - Left ventricle: The cavity size was normal. Wall thickness was normal. Wall motion was normal; there were no regional wall motion abnormalities. Doppler parameters are consistent with abnormal left ventricular relaxation (grade 1 diastolic dysfunction). - Left atrium: The atrium was mildly dilated. - Mass noted of 5.23x 3.82 cm with compression right atrium   Chest CT on 08/31/11 showed: Large right anterior mediastinal mass superior anterior and lateral to the right atrium, 6.5 x 4.7 x 5.9 cm in size.  Small hypervascular nodule at posterior aspect right lobe liver,  1.4 x 1.3 cm in size. She underwent biopsy of her mediastinal mass on 09/28/11 with pathology results consistent with thymoma.  CXR from 10/29/11 showed stable anterior mediastinal mass at the right lung base adjacent to the right heart border. Mild cardiomegaly.  EKG from 10/29/11 showed SB.  Labs acceptable.  Plan to proceed.

## 2011-10-31 ENCOUNTER — Observation Stay (HOSPITAL_COMMUNITY)
Admission: RE | Admit: 2011-10-31 | Discharge: 2011-11-03 | DRG: 394 | Disposition: A | Discharge status: Home / Self Care | Payer: BC Managed Care – PPO | Source: Ambulatory Visit | Attending: Cardiothoracic Surgery | Admitting: Cardiothoracic Surgery

## 2011-10-31 ENCOUNTER — Encounter (HOSPITAL_COMMUNITY)
Admission: RE | Disposition: A | Discharge status: Home / Self Care | Payer: Self-pay | Source: Ambulatory Visit | Attending: Cardiothoracic Surgery

## 2011-10-31 ENCOUNTER — Other Ambulatory Visit: Payer: Self-pay | Admitting: Cardiothoracic Surgery

## 2011-10-31 ENCOUNTER — Inpatient Hospital Stay (HOSPITAL_COMMUNITY): Payer: BC Managed Care – PPO

## 2011-10-31 ENCOUNTER — Encounter (HOSPITAL_COMMUNITY): Payer: Self-pay | Admitting: *Deleted

## 2011-10-31 ENCOUNTER — Encounter (HOSPITAL_COMMUNITY): Payer: Self-pay | Admitting: Vascular Surgery

## 2011-10-31 ENCOUNTER — Ambulatory Visit (HOSPITAL_COMMUNITY): Payer: BC Managed Care – PPO

## 2011-10-31 ENCOUNTER — Ambulatory Visit (HOSPITAL_COMMUNITY): Payer: BC Managed Care – PPO | Admitting: Vascular Surgery

## 2011-10-31 DIAGNOSIS — E669 Obesity, unspecified: Secondary | ICD-10-CM | POA: Insufficient documentation

## 2011-10-31 DIAGNOSIS — E039 Hypothyroidism, unspecified: Secondary | ICD-10-CM | POA: Insufficient documentation

## 2011-10-31 DIAGNOSIS — G4700 Insomnia, unspecified: Secondary | ICD-10-CM | POA: Insufficient documentation

## 2011-10-31 DIAGNOSIS — J9859 Other diseases of mediastinum, not elsewhere classified: Secondary | ICD-10-CM | POA: Diagnosis present

## 2011-10-31 DIAGNOSIS — E7800 Pure hypercholesterolemia, unspecified: Secondary | ICD-10-CM | POA: Insufficient documentation

## 2011-10-31 DIAGNOSIS — D150 Benign neoplasm of thymus: Principal | ICD-10-CM | POA: Insufficient documentation

## 2011-10-31 DIAGNOSIS — R222 Localized swelling, mass and lump, trunk: Secondary | ICD-10-CM

## 2011-10-31 HISTORY — PX: MEDIASTERNOTOMY: SHX5084

## 2011-10-31 LAB — POCT I-STAT 3, ART BLOOD GAS (G3+)
Bicarbonate: 25 mEq/L — ABNORMAL HIGH (ref 20.0–24.0)
Bicarbonate: 25.8 mEq/L — ABNORMAL HIGH (ref 20.0–24.0)
O2 Saturation: 99 %
O2 Saturation: 99 %
TCO2: 26 mmol/L (ref 0–100)
TCO2: 27 mmol/L (ref 0–100)
pCO2 arterial: 42.8 mmHg (ref 35.0–45.0)
pCO2 arterial: 47.6 mmHg — ABNORMAL HIGH (ref 35.0–45.0)
pH, Arterial: 7.375 (ref 7.350–7.400)
pH, Arterial: 7.343 — ABNORMAL LOW (ref 7.350–7.400)
pO2, Arterial: 130 mmHg — ABNORMAL HIGH (ref 80.0–100.0)
pO2, Arterial: 143 mmHg — ABNORMAL HIGH (ref 80.0–100.0)

## 2011-10-31 LAB — CBC
HCT: 34.3 % — ABNORMAL LOW (ref 36.0–46.0)
Hemoglobin: 11.4 g/dL — ABNORMAL LOW (ref 12.0–15.0)
MCH: 27.8 pg (ref 26.0–34.0)
MCHC: 33.2 g/dL (ref 30.0–36.0)
MCV: 83.7 fL (ref 78.0–100.0)
Platelets: 164 10*3/uL (ref 150–400)
RBC: 4.1 MIL/uL (ref 3.87–5.11)
RDW: 13.6 % (ref 11.5–15.5)
WBC: 7.8 10*3/uL (ref 4.0–10.5)

## 2011-10-31 LAB — POCT I-STAT 4, (NA,K, GLUC, HGB,HCT)
Glucose, Bld: 178 mg/dL — ABNORMAL HIGH (ref 70–99)
HCT: 32 % — ABNORMAL LOW (ref 36.0–46.0)
Hemoglobin: 10.9 g/dL — ABNORMAL LOW (ref 12.0–15.0)
Potassium: 3.7 mEq/L (ref 3.5–5.1)
Sodium: 140 mEq/L (ref 135–145)

## 2011-10-31 SURGERY — MEDIAN STERNOTOMY
Anesthesia: General | Site: Chest | Wound class: Clean

## 2011-10-31 MED ORDER — LACTATED RINGERS IV SOLN: INTRAVENOUS | Status: DC

## 2011-10-31 MED ORDER — PANTOPRAZOLE SODIUM 40 MG PO TBEC: 40.0000 mg | DELAYED_RELEASE_TABLET | Freq: Every day | ORAL | Status: DC

## 2011-10-31 MED ORDER — VANCOMYCIN HCL 1000 MG IV SOLR
1000.0000 mg | Freq: Once | INTRAVENOUS | Status: AC
  Administered 2011-10-31: 1000 mg via INTRAVENOUS
  Filled 2011-10-31: qty 1000

## 2011-10-31 MED ORDER — SODIUM CHLORIDE 0.45 % IV SOLN
INTRAVENOUS | Status: DC
  Administered 2011-10-31: 12:00:00 via INTRAVENOUS

## 2011-10-31 MED ORDER — MIDAZOLAM HCL 2 MG/2ML IJ SOLN: 2.0000 mg | INTRAMUSCULAR | Status: DC | PRN

## 2011-10-31 MED ORDER — SODIUM CHLORIDE 0.9 % IV SOLN: INTRAVENOUS | Status: DC

## 2011-10-31 MED ORDER — PROPOFOL 10 MG/ML IV BOLUS
INTRAVENOUS | Status: DC | PRN
  Administered 2011-10-31: 150 mg via INTRAVENOUS
  Administered 2011-10-31: 50 mg via INTRAVENOUS

## 2011-10-31 MED ORDER — METOPROLOL TARTRATE 12.5 MG HALF TABLET
12.5000 mg | ORAL_TABLET | Freq: Two times a day (BID) | ORAL | Status: DC
  Filled 2011-10-31 (×3): qty 1

## 2011-10-31 MED ORDER — ACETAMINOPHEN 500 MG PO TABS
1000.0000 mg | ORAL_TABLET | Freq: Four times a day (QID) | ORAL | Status: DC
  Administered 2011-10-31 – 2011-11-01 (×3): 1000 mg via ORAL
  Filled 2011-10-31 (×7): qty 2

## 2011-10-31 MED ORDER — PROMETHAZINE HCL 25 MG/ML IJ SOLN: 6.2500 mg | INTRAMUSCULAR | Status: DC | PRN

## 2011-10-31 MED ORDER — SODIUM CHLORIDE 0.9 % IJ SOLN
OROMUCOSAL | Status: DC | PRN
  Administered 2011-10-31: 10:00:00 via TOPICAL

## 2011-10-31 MED ORDER — GLYCOPYRROLATE 0.2 MG/ML IJ SOLN
INTRAMUSCULAR | Status: DC | PRN
  Administered 2011-10-31: .3 mg via INTRAVENOUS

## 2011-10-31 MED ORDER — BISACODYL 5 MG PO TBEC: 10.0000 mg | DELAYED_RELEASE_TABLET | Freq: Every day | ORAL | Status: DC

## 2011-10-31 MED ORDER — LIDOCAINE HCL (CARDIAC) 20 MG/ML IV SOLN
INTRAVENOUS | Status: DC | PRN
  Administered 2011-10-31: 100 mg via INTRAVENOUS

## 2011-10-31 MED ORDER — LACTATED RINGERS IV SOLN: 500.0000 mL | Freq: Once | INTRAVENOUS | Status: AC | PRN

## 2011-10-31 MED ORDER — DEXTROSE 5 % IV SOLN
1.5000 g | Freq: Two times a day (BID) | INTRAVENOUS | Status: DC
  Administered 2011-10-31 (×2): 1.5 g via INTRAVENOUS
  Filled 2011-10-31 (×4): qty 1.5

## 2011-10-31 MED ORDER — SUCCINYLCHOLINE CHLORIDE 20 MG/ML IJ SOLN
INTRAMUSCULAR | Status: DC | PRN
  Administered 2011-10-31: 80 mg via INTRAVENOUS
  Administered 2011-10-31: 120 mg via INTRAVENOUS

## 2011-10-31 MED ORDER — ACETAMINOPHEN 160 MG/5ML PO SOLN
975.0000 mg | Freq: Four times a day (QID) | ORAL | Status: DC
  Filled 2011-10-31: qty 40.6

## 2011-10-31 MED ORDER — SODIUM CHLORIDE 0.9 % IV SOLN: 250.0000 mL | INTRAVENOUS | Status: DC

## 2011-10-31 MED ORDER — SODIUM CHLORIDE 0.9 % IJ SOLN: 3.0000 mL | Freq: Two times a day (BID) | INTRAMUSCULAR | Status: DC

## 2011-10-31 MED ORDER — METOPROLOL TARTRATE 1 MG/ML IV SOLN: 2.5000 mg | INTRAVENOUS | Status: DC | PRN

## 2011-10-31 MED ORDER — DEXMEDETOMIDINE HCL 100 MCG/ML IV SOLN
200.0000 ug | INTRAVENOUS | Status: DC | PRN
  Administered 2011-10-31: .7 ug/kg/h via INTRAVENOUS

## 2011-10-31 MED ORDER — FENTANYL CITRATE 0.05 MG/ML IJ SOLN
INTRAMUSCULAR | Status: DC | PRN
  Administered 2011-10-31: 200 ug via INTRAVENOUS
  Administered 2011-10-31: 100 ug via INTRAVENOUS
  Administered 2011-10-31: 50 ug via INTRAVENOUS
  Administered 2011-10-31: 150 ug via INTRAVENOUS

## 2011-10-31 MED ORDER — HYDROMORPHONE HCL PF 1 MG/ML IJ SOLN: 0.2500 mg | INTRAMUSCULAR | Status: DC | PRN

## 2011-10-31 MED ORDER — METOPROLOL TARTRATE 25 MG/10 ML ORAL SUSPENSION
12.5000 mg | Freq: Two times a day (BID) | ORAL | Status: DC
  Filled 2011-10-31 (×3): qty 5

## 2011-10-31 MED ORDER — ACETAMINOPHEN 160 MG/5ML PO SOLN: 650.0000 mg | ORAL | Status: DC

## 2011-10-31 MED ORDER — PHENYLEPHRINE HCL 10 MG/ML IJ SOLN
10.0000 mg | INTRAMUSCULAR | Status: DC | PRN
  Administered 2011-10-31: 15 ug/min via INTRAVENOUS

## 2011-10-31 MED ORDER — DOCUSATE SODIUM 100 MG PO CAPS: 200.0000 mg | ORAL_CAPSULE | Freq: Every day | ORAL | Status: DC

## 2011-10-31 MED ORDER — BISACODYL 10 MG RE SUPP: 10.0000 mg | Freq: Every day | RECTAL | Status: DC

## 2011-10-31 MED ORDER — FENTANYL CITRATE 0.05 MG/ML IJ SOLN: 50.0000 ug | INTRAMUSCULAR | Status: DC | PRN

## 2011-10-31 MED ORDER — SODIUM CHLORIDE 0.9 % IV SOLN
0.4000 ug/kg/h | INTRAVENOUS | Status: DC
  Filled 2011-10-31: qty 4

## 2011-10-31 MED ORDER — LACTATED RINGERS IV SOLN
INTRAVENOUS | Status: DC | PRN
  Administered 2011-10-31: 08:00:00 via INTRAVENOUS

## 2011-10-31 MED ORDER — FAMOTIDINE IN NACL 20-0.9 MG/50ML-% IV SOLN
20.0000 mg | Freq: Two times a day (BID) | INTRAVENOUS | Status: DC
  Administered 2011-10-31: 20 mg via INTRAVENOUS
  Filled 2011-10-31: qty 50

## 2011-10-31 MED ORDER — VECURONIUM BROMIDE 10 MG IV SOLR
INTRAVENOUS | Status: DC | PRN
  Administered 2011-10-31: 6 mg via INTRAVENOUS
  Administered 2011-10-31: 4 mg via INTRAVENOUS

## 2011-10-31 MED ORDER — SODIUM CHLORIDE 0.9 % IV SOLN
0.1000 ug/kg/h | INTRAVENOUS | Status: DC
  Filled 2011-10-31 (×2): qty 2

## 2011-10-31 MED ORDER — MORPHINE SULFATE 4 MG/ML IJ SOLN
2.0000 mg | INTRAMUSCULAR | Status: DC | PRN
  Filled 2011-10-31: qty 1

## 2011-10-31 MED ORDER — HETASTARCH-ELECTROLYTES 6 % IV SOLN
INTRAVENOUS | Status: DC | PRN
  Administered 2011-10-31: 10:00:00 via INTRAVENOUS

## 2011-10-31 MED ORDER — SODIUM CHLORIDE 0.9 % IJ SOLN: 3.0000 mL | INTRAMUSCULAR | Status: DC | PRN

## 2011-10-31 MED ORDER — 0.9 % SODIUM CHLORIDE (POUR BTL) OPTIME
TOPICAL | Status: DC | PRN
  Administered 2011-10-31: 2000 mL

## 2011-10-31 MED ORDER — MIDAZOLAM HCL 5 MG/5ML IJ SOLN
INTRAMUSCULAR | Status: DC | PRN
  Administered 2011-10-31 (×2): 2 mg via INTRAVENOUS

## 2011-10-31 MED ORDER — LORAZEPAM 2 MG/ML IJ SOLN: 1.0000 mg | Freq: Once | INTRAMUSCULAR | Status: DC | PRN

## 2011-10-31 MED ORDER — MIDAZOLAM HCL 2 MG/2ML IJ SOLN: 1.0000 mg | INTRAMUSCULAR | Status: DC | PRN

## 2011-10-31 MED ORDER — EPHEDRINE SULFATE 50 MG/ML IJ SOLN
INTRAMUSCULAR | Status: DC | PRN
  Administered 2011-10-31: 10 mg via INTRAVENOUS

## 2011-10-31 MED ORDER — ALBUMIN HUMAN 5 % IV SOLN: 250.0000 mL | INTRAVENOUS | Status: DC | PRN

## 2011-10-31 MED ORDER — MORPHINE SULFATE 2 MG/ML IJ SOLN: 1.0000 mg | INTRAMUSCULAR | Status: AC | PRN

## 2011-10-31 MED ORDER — OXYCODONE HCL 5 MG PO TABS
5.0000 mg | ORAL_TABLET | ORAL | Status: DC | PRN
  Administered 2011-10-31 – 2011-11-01 (×3): 10 mg via ORAL
  Filled 2011-10-31 (×3): qty 2

## 2011-10-31 MED ORDER — ACETAMINOPHEN 650 MG RE SUPP: 650.0000 mg | RECTAL | Status: DC

## 2011-10-31 MED ORDER — ONDANSETRON HCL 4 MG/2ML IJ SOLN
INTRAMUSCULAR | Status: DC | PRN
  Administered 2011-10-31: 4 mg via INTRAVENOUS

## 2011-10-31 MED ORDER — ONDANSETRON HCL 4 MG/2ML IJ SOLN: 4.0000 mg | Freq: Four times a day (QID) | INTRAMUSCULAR | Status: DC | PRN

## 2011-10-31 SURGICAL SUPPLY — 53 items
BAG URIMETER BARDEX IC 350 (UROLOGICAL SUPPLIES) IMPLANT
BLADE SURG 11 STRL SS (BLADE) IMPLANT
CANISTER SUCTION 2500CC (MISCELLANEOUS) ×3 IMPLANT
CATH FOLEY 2WAY SLVR  5CC 16FR (CATHETERS)
CATH FOLEY 2WAY SLVR 5CC 16FR (CATHETERS) IMPLANT
CATH THORACIC 28FR (CATHETERS) IMPLANT
CATH THORACIC 28FR RT ANG (CATHETERS) IMPLANT
CLIP TI MEDIUM 24 (CLIP) ×3 IMPLANT
CLIP TI WIDE RED SMALL 24 (CLIP) ×3 IMPLANT
CLOTH BEACON ORANGE TIMEOUT ST (SAFETY) ×3 IMPLANT
CONN ST 1/4X3/8  BEN (MISCELLANEOUS) ×1
CONN ST 1/4X3/8 BEN (MISCELLANEOUS) ×2 IMPLANT
CONN Y 3/8X3/8X3/8  BEN (MISCELLANEOUS) ×1
CONN Y 3/8X3/8X3/8 BEN (MISCELLANEOUS) ×2 IMPLANT
CONT SPEC 4OZ CLIKSEAL STRL BL (MISCELLANEOUS) ×3 IMPLANT
COVER SURGICAL LIGHT HANDLE (MISCELLANEOUS) ×6 IMPLANT
DRAPE LAPAROSCOPIC ABDOMINAL (DRAPES) ×3 IMPLANT
DRSG COVADERM 4X14 (GAUZE/BANDAGES/DRESSINGS) ×3 IMPLANT
ELECT REM PT RETURN 9FT ADLT (ELECTROSURGICAL) ×3
ELECTRODE REM PT RTRN 9FT ADLT (ELECTROSURGICAL) ×2 IMPLANT
GEL ULTRASOUND 20GR AQUASONIC (MISCELLANEOUS) IMPLANT
GLOVE BIO SURGEON STRL SZ 6.5 (GLOVE) ×6 IMPLANT
GLOVE BIO SURGEON STRL SZ7.5 (GLOVE) ×6 IMPLANT
GLOVE SURG EUDERMIC 8 LTX PF (GLOVE) ×3 IMPLANT
GOWN EXTRA PROTECTION XL (GOWNS) ×3 IMPLANT
GOWN STRL NON-REIN LRG LVL3 (GOWN DISPOSABLE) ×9 IMPLANT
HEMOSTAT POWDER SURGIFOAM 1G (HEMOSTASIS) ×3 IMPLANT
KIT BASIN OR (CUSTOM PROCEDURE TRAY) ×3 IMPLANT
KIT PAIN CUSTOM (MISCELLANEOUS) IMPLANT
KIT ROOM TURNOVER OR (KITS) ×3 IMPLANT
KIT SUCTION CATH 14FR (SUCTIONS) IMPLANT
NS IRRIG 1000ML POUR BTL (IV SOLUTION) ×6 IMPLANT
PACK CHEST (CUSTOM PROCEDURE TRAY) ×3 IMPLANT
PAD ARMBOARD 7.5X6 YLW CONV (MISCELLANEOUS) ×6 IMPLANT
SPONGE GAUZE 4X4 12PLY (GAUZE/BANDAGES/DRESSINGS) ×3 IMPLANT
SUT PROLENE 4 0 RB 1 (SUTURE)
SUT PROLENE 4-0 RB1 .5 CRCL 36 (SUTURE) IMPLANT
SUT SILK  1 MH (SUTURE) ×2
SUT SILK 1 MH (SUTURE) ×4 IMPLANT
SUT SILK 2 0 SH CR/8 (SUTURE) ×3 IMPLANT
SUT STEEL 6MS V (SUTURE) ×6 IMPLANT
SUT STEEL SZ 6 DBL 3X14 BALL (SUTURE) ×3 IMPLANT
SUT VIC AB 1 CT1 18XCR BRD 8 (SUTURE) ×4 IMPLANT
SUT VIC AB 1 CT1 8-18 (SUTURE) ×2
SUT VIC AB 1 CTX 27 (SUTURE) IMPLANT
SUT VIC AB 2-0 CTX 36 (SUTURE) ×6 IMPLANT
SUT VIC AB 3-0 X1 27 (SUTURE) ×6 IMPLANT
SYSTEM SAHARA CHEST DRAIN RE-I (WOUND CARE) ×3 IMPLANT
TAPE CLOTH SURG 4X10 WHT LF (GAUZE/BANDAGES/DRESSINGS) ×3 IMPLANT
TOWEL OR 17X24 6PK STRL BLUE (TOWEL DISPOSABLE) ×6 IMPLANT
TOWEL OR 17X26 10 PK STRL BLUE (TOWEL DISPOSABLE) ×6 IMPLANT
TRAY FOLEY CATH 14FR (SET/KITS/TRAYS/PACK) ×3 IMPLANT
WATER STERILE IRR 1000ML POUR (IV SOLUTION) ×3 IMPLANT

## 2011-10-31 NOTE — Progress Notes (Signed)
Pt extubated @ 1550. No complications with extubation. Cuff leak audible. Pt. Placed on 3L [place]. Bilateral breath sounds, coarse sounds in RUL. Productive cough. SATS 99%. Incentive 1150 mls. RT will continue to monitor pt progress.

## 2011-10-31 NOTE — Anesthesia Procedure Notes (Signed)
Date/Time: 10/31/2011 8:50 AM Performed by: Rosita Fire Difficulty Due To: Difficulty was anticipated, Difficult Airway- due to anterior larynx and Difficult Airway- due to limited oral opening Future Recommendations: Recommend- induction with short-acting agent, and alternative techniques readily available

## 2011-10-31 NOTE — Anesthesia Preprocedure Evaluation (Addendum)
Anesthesia Evaluation  Patient identified by MRN, date of birth, ID band Patient awake    Reviewed: Allergy & Precautions, H&P , NPO status , Patient's Chart, lab work & pertinent test results  Airway Mallampati: II TM Distance: >3 FB Neck ROM: Full    Dental  (+) Teeth Intact   Pulmonary sleep apnea and Continuous Positive Airway Pressure Ventilation , Recent URI ,    Pulmonary exam normal       Cardiovascular     Neuro/Psych  Neuromuscular disease    GI/Hepatic   Endo/Other  Hypothyroidism   Renal/GU      Musculoskeletal   Abdominal   Peds  Hematology   Anesthesia Other Findings   Reproductive/Obstetrics                          Anesthesia Physical Anesthesia Plan  ASA: III  Anesthesia Plan: General   Post-op Pain Management:    Induction: Intravenous  Airway Management Planned: Oral ETT  Additional Equipment: Arterial line  Intra-op Plan:   Post-operative Plan: Post-operative intubation/ventilation  Informed Consent: I have reviewed the patients History and Physical, chart, labs and discussed the procedure including the risks, benefits and alternatives for the proposed anesthesia with the patient or authorized representative who has indicated his/her understanding and acceptance.     Plan Discussed with: CRNA, Surgeon and Anesthesiologist  Anesthesia Plan Comments:       Anesthesia Quick Evaluation

## 2011-10-31 NOTE — Transfer of Care (Signed)
Immediate Anesthesia Transfer of Care Note  Patient: April [name]  Procedure(s) Performed: Procedure(s) (LRB): MEDIASTERNOTOMY (N/A) THYMECTOMY (N/A)  Patient Location: SICU  Anesthesia Type: General  Level of Consciousness: sedated  Airway & Oxygen Therapy: Patient remains intubated per anesthesia plan  Post-op Assessment: Post -op Vital signs reviewed and stable  Post vital signs: Reviewed and stable  Complications: No apparent anesthesia complications

## 2011-10-31 NOTE — Brief Op Note (Signed)
10/31/2011  11:07 AM  PATIENT:  April [name]  61 y.o. female  PRE-OPERATIVE DIAGNOSIS:  THYMIC MASS  POST-OPERATIVE DIAGNOSIS:  Thymic Mass  PROCEDURE:  Procedure(s) (LRB): MEDIASTERNOTOMY (N/A) THYMECTOMY (N/A)  SURGEON:  Surgeon(s) and Role:    * Delight Ovens, MD - Primary    ASSISTANTS:Ronnie Scott SA  ANESTHESIA:   general  EBL:  Total I/O In: 1600 [I.V.:1600] Out: 900 [Urine:800; Blood:100]  BLOOD ADMINISTERED:none  DRAINS: Urinary Catheter (Foley), 28 Chest Tube(s) in the rt chest and (28) Blake drain(s) in the mediastinum     SPECIMEN:  Source of Specimen:  mediastinum  DISPOSITION OF SPECIMEN:  PATHOLOGY  COUNTS:  YES   DICTATION: .Other Dictation: Dictation Number   PLAN OF CARE: Admit to inpatient   PATIENT DISPOSITION:  ICU - intubated and hemodynamically stable.   Delay start of Pharmacological VTE agent (>24hrs) due to surgical blood loss or risk of bleeding: yes

## 2011-10-31 NOTE — Preoperative (Signed)
Beta Blockers   Reason not to administer Beta Blockers:Not Applicable 

## 2011-10-31 NOTE — H&P (Signed)
[address]            [place] [address]          [phone]       April [name] [facility] Medical Record #[id number] Date of Birth: May 23, 1951  Referring: Dr Genene Churn Primary Care: Mia Creek, MD, MD  Chief Complaint:    Mediastinal mass   History of Present Illness:     61 yo female with not history of neurological problems who is found on chest xray to mediastinal mass. CT done in Dec 2012 confirmed anterior mediastinal mass. Needle bx has been done and patient referred for surgical consult. She has no symptoms of muscle weakness.    Current Activity/ Functional Status: Patient Patient is independent with mobility/ambulation, transfers, ADL's, IADL's. independent with mobility/ambulation, transfers, ADL's, IADL's.   Past Medical History  Diagnosis Date  . Obesity   . Hypothyroidism   . Insomnia   . Mineral deficiency   . Hypochlorhydria   . Hypercholesteremia   . Cataract   . Vitamin d deficiency   . Fatigue   . Candidiasis of intestine   . Vitamin d deficiency   . Radiculopathy, lumbar region   . Bronchitis   . Sleep apnea     ?? SETTING  . Recurrent upper respiratory infection (URI)     COLD IN JAN....STILL HAS OCCAS. COUGHING    Past Surgical History  Procedure Date  . Laparoscopic cholecystectomy   . Gallbladder surgery   . Tonsillectomy   . Lung biopsy     Family History: no family history of mediastinal mass  History   Social History  . Marital Status: [name] partner April [name]     Spouse Name: N/A    Number of Children: N/A   . Years of Education: N/A   Occupational History  . Not on file.   Social History Main Topics  . Smoking status: Former Smoker -- 1.0 packs/day for 17 years    Types: Cigarettes    Quit date: 09/26/1983  . Smokeless tobacco: Not on file  . Alcohol Use: No  . Drug Use: No  . Sexually Active: Not on file   Other Topics Concern  . Not on file   Social  History Narrative  . No narrative on file    History  Smoking status  . Former Smoker -- 1.0 packs/day for 17 years  . Types: Cigarettes  . Quit date: 09/26/1983  Smokeless tobacco  . Not on file    History  Alcohol Use No     No Known Allergies  Prescriptions prior to admission  Medication Sig Dispense Refill  . Acetylcysteine (N-ACETYL-L-CYSTEINE PO) Take 1 tablet by mouth daily.      . Ascorbic Acid (VITAMIN C) 1000 MG tablet Take 1,000 mg by mouth daily.        Marland Kitchen BETAINE PO Take 3 tablets by mouth 3 (three) times daily with meals.       . Cholecalciferol (VITAMIN D3) 5000 UNITS CAPS Take 1 tablet by mouth daily.       . Coenzyme Q10 (COQ10 PO) Take 1 capsule by mouth daily.      . Iodine Strong, Lugols, (IODINE STRONG PO) Take 2 capsules by mouth 2 (two) times daily.      Marland Kitchen liothyronine (CYTOMEL) 5 MCG  tablet Take 5-10 mcg by mouth 2 (two) times daily. 2 tablets every morning and 1 tablet in the afternoon.      Marland Kitchen MILK THISTLE PO Take 1 capsule by mouth daily.       . Multiple Vitamins-Minerals (ZINC PO) Take 1 capsule by mouth daily.       . NON FORMULARY Take 2 capsules by mouth 3 (three) times daily with meals. Ultra nutrient supplement       . NON FORMULARY Take 1 capsule by mouth every evening. Annatto Tocotrienols Supplement        . NON FORMULARY Take 1 tablet by mouth 3 (three) times daily with meals. Primary Digest Supplement.       Marland Kitchen OVER THE COUNTER MEDICATION Take 1 capsule by mouth 3 (three) times daily with meals. Digestion GB.      Marland Kitchen OVER THE COUNTER MEDICATION Take 1 tablet by mouth 2 (two) times daily. hemagenics iron supplement.      . pravastatin (PRAVACHOL) 40 MG tablet Take 40 mg by mouth every evening.       . Selenium 200 MCG CAPS Take 1 capsule by mouth daily.       . Taurine 1000 MG CAPS Take 1 capsule by mouth daily.       Marland Kitchen thyroid (ARMOUR) 60 MG tablet Take 90 mg by mouth daily before breakfast.       . vitamin A 16109 UNIT capsule Take 25,000  Units by mouth daily.           Review of Systems:     Cardiac Review of Systems: Y or N  Chest Pain [  n  ]  Resting SOB [ nn  ] Exertional SOB  [ n ]  Orthopnea [  n]   Pedal Edema [  n ]    Palpitations [n  ] Syncope  [n  ]   Presyncope [ n  ]  General Review of Systems: [Y] = yes [  ]=no Consitutional: recent weight change [ y ]; anorexia [ n ]; fatigue [ y ]; nausea [n  ]; night sweats [ n ]; fever [ n ]; or chills [n  ];                                                                                                                                          Dental: poor dentition[n  ];   Eye : blurred vision [  ]; diplopia [   ]; vision changes [  ];  Amaurosis fugax[  ]; Resp: cough [  ];  wheezing[  ];  hemoptysis[  ]; shortness of breath[  ]; paroxysmal nocturnal dyspnea[  ]; dyspnea on exertion[  ]; or orthopnea[  ];  GI:  gallstones[  ], vomiting[  ];  dysphagia[  ]; melena[  ];  hematochezia [  ]; heartburn[  ];  Hx of  Colonoscopy[  ]; GU: kidney stones [  ]; hematuria[  ];   dysuria [  ];  nocturia[  ];  history of     obstruction [  ];             Skin: rash, swelling[  ];, hair loss[  ];  peripheral edema[  ];  or itching[  ]; Musculoskeletal: myalgias[  ];  joint swelling[  ];  joint erythema[  ];  joint pain[  ];  back pain[  ];  Heme/Lymph: bruising[  ];  bleeding[  ];  anemia[  ];  Neuro: TIA[  ];  headaches[  ];  stroke[  ];  vertigo[  ];  seizures[  ];   paresthesias[  ];  difficulty walking[  ];  Psych:depression[  ]; anxiety[  ];  Endocrine: diabetes[  ];  thyroid dysfunction[  ];  Immunizations: Flu Cove.Etienne  ]; Pneumococcal[ n ];  Other:  Physical Exam: BP 114/80  Pulse 70  Temp(Src) 98.1 F (36.7 C) (Oral)  Resp 18  SpO2 98%  General appearance: alert, cooperative, appears stated age and no distress Neurologic: intact Heart: regular rate and rhythm, S1, S2 normal, no murmur, click, rub or gallop Lungs: clear to auscultation bilaterally Abdomen: soft,  non-tender; bowel sounds normal; no masses,  no organomegaly Extremities: extremities normal, atraumatic, no cyanosis or edema and Homans sign is negative, no sign of DVT no cervical adenopathy palapble No changes since exam in office   Diagnostic Studies & Laboratory data:     Recent Radiology Findings:  Ct Biopsy  09/28/2011  *RADIOLOGY REPORT*  Clinical Data: Right anterior mediastinal mass.  CT GUIDED CORE BIOPSY OF MEDIASTINAL MASS  Sedation:   1.0 mg IV Versed;  50 mcg IV Fentanyl  Total Moderate Sedation Time: 10 minutes.  Procedure:  The procedure risks, benefits, and alternatives were explained to the patient.  Questions regarding the procedure were encouraged and answered.  The patient understands and consents to the procedure.  The right parasternal region was prepped with Betadine in a sterile fashion, and a sterile drape was applied covering the operative field.  A sterile gown and sterile gloves were used for the procedure.  Local anesthesia was provided with 1% Lidocaine.  Initial unenhanced CT was performed in a supine position.  After choosing a site for needle entry, a 17 gauge needle was advanced under CT guidance to the level of the mediastinal mass from a right parasternal approach.  After confirming needle tip positioning, coaxial core biopsy samples were obtained with an 18 gauge needle device. A total of four intact core biopsy specimens were submitted in saline to Pathology.  Complications: None  Findings: Initial imaging again demonstrates a large right-sided anterior mediastinal mass.  Maximal tumor diameter is 7.2 cm. Solid core biopsy samples were obtained from the tumor.  Material was submitted in saline to facilitate any potential need for flow cytometry.  IMPRESSION: CT guided core biopsy performed of the right anterior mediastinal mass.  Original Report Authenticated By: Reola Calkins, M.D.     RADIOLOGY REPORT*  Clinical Data: Abnormal chest x-ray  CT CHEST WITH  CONTRAST  Technique: Multidetector CT imaging of the chest was performed  following the standard protocol during bolus administration of  intravenous contrast. Sagittal and coronal MPR images reconstructed  from axial data set.  Contrast: 80mL OMNIPAQUE IOHEXOL 300 MG/ML IV SOLN  Comparison: None  Findings:  Vascular structures patent on non dedicated exam.  Minimal  scattered atherosclerotic calcification aorta.  Large mass identified right lateral aspect of the anterior  mediastinum adjacent to the aorta, SVC and superior margin of right  atrium, measuring 6.5 x 4.7 x 5.9 cm in size. No definite invasion  of the adjacent structures is identified, though not excluded.  On axial images there is a thin rim of peripheral low attenuation  at the antero-right lateral aspect of this lesion which could  represent fluid.  No additional associated thoracic adenopathy.  Small enhancing nodule at posterior aspect right lobe liver 1.4 x  1.3 cm image 59.  Remaining visualized portions of upper abdomen normal appearance.  Lungs clear.  No pleural effusion or pneumothorax.  Subtle lytic lesion within T9 vertebral body, nonspecific, could  potentially represent a small hemangioma.  IMPRESSION:  Large right anterior mediastinal mass superior anterior and lateral  to the right atrium, 6.5 x 4.7 x 5.9 cm in size.  Differential diagnosis includes thymic tumor, lymphoma, complicated  cyst of thymic or pericardial origin, less likely teratoma.  Recommend MR imaging of the chest with/without contrast to  characterize.  Small hypervascular nodule at posterior aspect right lobe liver,  1.4 x 1.3 cm in size.  This too requires follow-up imaging to determine significance; this  is likely too far inferior to be included within field of view at  time of thoracic MR imaging.  Potentially this could represent an atypical hepatic hemangioma;  may evaluate this further by MR imaging or potentially  sonography.   Recent Lab Findings: Lab Results  Component Value Date   WBC 8.6 10/29/2011   HGB 12.9 10/29/2011   HCT 37.9 10/29/2011   PLT 213 10/29/2011   GLUCOSE 105* 10/29/2011   ALT 23 10/29/2011   AST 25 10/29/2011   NA 139 10/29/2011   K 4.2 10/29/2011   CL 103 10/29/2011   CREATININE 0.60 10/29/2011   BUN 14 10/29/2011   CO2 24 10/29/2011   INR 1.02 10/29/2011   PFTS completed   Assessment / Plan:    #1 Large Mediastinal Mass anterior consistent with Thymoma  #2 1.4 hypervascular mass rt lobe of liver   I have reviewed diagnosis with patient and partner [facility]. We reviewed CT and path findings findings and discussed details of surgical resection via partial or full sternotomy.Resection has been recommended. The potential need for follow up radiation treatment is also discussed depending on operative findings is also discussed.  The goals risks and alternatives of the planned surgical procedure Resection of mediastinal mass via sternontomy have been discussed with the patient in detail. The risks of the procedure including death, infection, stroke, phrenic nerve injury, myocardial infarction, bleeding, blood transfusion have all been discussed specifically.  I have quoted April [name] a 1 % of perioperative mortality and a complication rate as high as 10 %. The patient's questions have been answered.April [name] is willing  to proceed with the planned procedure.  Plan to proceed today   Delight Ovens MD  Beeper [phone] Office [phone] 10/19/2011

## 2011-10-31 NOTE — Anesthesia Postprocedure Evaluation (Signed)
  Anesthesia Post-op Note  Patient: April [name]  Procedure(s) Performed: Procedure(s) (LRB): MEDIASTERNOTOMY (N/A) THYMECTOMY (N/A)  Patient Location: SICU  Anesthesia Type: General  Level of Consciousness: sedated  Airway and Oxygen Therapy: Patient remains intubated per anesthesia plan and Patient placed on Ventilator (see vital sign flow sheet for setting)  Post-op Pain: none  Post-op Assessment: Post-op Vital signs reviewed, Patient's Cardiovascular Status Stable, Respiratory Function Stable, Patent Airway and No signs of Nausea or vomiting  Post-op Vital Signs: Reviewed and stable  Complications: No apparent anesthesia complications

## 2011-10-31 NOTE — Progress Notes (Signed)
The patient is status post median sternotomy and resection of a thymoma. The patient is extubated breathing comfortably with minimal chest tube drainage. Breath sounds are clear. Oxygen saturation greater than 94%

## 2011-10-31 NOTE — Progress Notes (Signed)
Respiratory therapy note-patient does have a cuff leak.

## 2011-10-31 NOTE — Procedures (Signed)
Extubation Procedure Note  Patient Details:   Name: April [name] DOB: December 11, 1950 MRN: [id number]   Airway Documentation:  Airway 8 mm (Active)  Secured at (cm) 22 cm 10/31/2011  3:02 PM  Measured From Lips 10/31/2011  3:02 PM  Secured Location Right 10/31/2011  3:02 PM  Secured By Pink Tape 10/31/2011  3:02 PM  Site Condition Dry 10/31/2011  3:02 PM    Evaluation  O2 sats: stable throughout Complications: No apparent complications Patient did tolerate procedure well. Bilateral Breath Sounds: Coarse crackles;Clear Suctioning: Airway Yes  [name] 10/31/2011, 6:38 PM

## 2011-11-01 ENCOUNTER — Inpatient Hospital Stay (HOSPITAL_COMMUNITY): Payer: BC Managed Care – PPO

## 2011-11-01 LAB — TYPE AND SCREEN
ABO/RH(D): A POS
Antibody Screen: NEGATIVE
Unit division: 0
Unit division: 0

## 2011-11-01 LAB — CBC
HCT: 33.7 % — ABNORMAL LOW (ref 36.0–46.0)
Hemoglobin: 11.4 g/dL — ABNORMAL LOW (ref 12.0–15.0)
MCH: 28.1 pg (ref 26.0–34.0)
MCHC: 33.8 g/dL (ref 30.0–36.0)
MCV: 83 fL (ref 78.0–100.0)
Platelets: 210 10*3/uL (ref 150–400)
RBC: 4.06 MIL/uL (ref 3.87–5.11)
RDW: 13.7 % (ref 11.5–15.5)
WBC: 11 10*3/uL — ABNORMAL HIGH (ref 4.0–10.5)

## 2011-11-01 LAB — BASIC METABOLIC PANEL
BUN: 9 mg/dL (ref 6–23)
CO2: 26 mEq/L (ref 19–32)
Calcium: 9.1 mg/dL (ref 8.4–10.5)
Chloride: 106 mEq/L (ref 96–112)
Creatinine, Ser: 0.52 mg/dL (ref 0.50–1.10)
GFR calc Af Amer: >90 mL/min (ref >90)
GFR calc non Af Amer: >90 mL/min (ref >90)
Glucose, Bld: 115 mg/dL — ABNORMAL HIGH (ref 70–99)
Potassium: 4 mEq/L (ref 3.5–5.1)
Sodium: 138 mEq/L (ref 135–145)

## 2011-11-01 LAB — GLUCOSE, CAPILLARY
Glucose-Capillary: 89 mg/dL (ref 70–99)
Glucose-Capillary: 112 mg/dL — ABNORMAL HIGH (ref 70–99)
Glucose-Capillary: 97 mg/dL (ref 70–99)

## 2011-11-01 MED ORDER — BISACODYL 5 MG PO TBEC
10.0000 mg | DELAYED_RELEASE_TABLET | Freq: Every day | ORAL | Status: DC | PRN
  Filled 2011-11-01: qty 2

## 2011-11-01 MED ORDER — SIMVASTATIN 20 MG PO TABS
20.0000 mg | ORAL_TABLET | Freq: Every day | ORAL | Status: DC
  Administered 2011-11-01 – 2011-11-02 (×2): 20 mg via ORAL
  Filled 2011-11-01 (×3): qty 1

## 2011-11-01 MED ORDER — SODIUM CHLORIDE 0.9 % IJ SOLN: 3.0000 mL | INTRAMUSCULAR | Status: DC | PRN

## 2011-11-01 MED ORDER — THYROID 60 MG PO TABS
90.0000 mg | ORAL_TABLET | Freq: Every day | ORAL | Status: DC
  Administered 2011-11-02 – 2011-11-03 (×2): 90 mg via ORAL
  Filled 2011-11-01 (×3): qty 1

## 2011-11-01 MED ORDER — MORPHINE SULFATE 2 MG/ML IJ SOLN
2.0000 mg | Freq: Once | INTRAMUSCULAR | Status: AC
  Administered 2011-11-01: 2 mg via INTRAVENOUS

## 2011-11-01 MED ORDER — BISACODYL 10 MG RE SUPP: 10.0000 mg | Freq: Every day | RECTAL | Status: DC | PRN

## 2011-11-01 MED ORDER — TRAMADOL HCL 50 MG PO TABS: 50.0000 mg | ORAL_TABLET | ORAL | Status: DC | PRN

## 2011-11-01 MED ORDER — OXYCODONE HCL 5 MG PO TABS
5.0000 mg | ORAL_TABLET | ORAL | Status: DC | PRN
  Administered 2011-11-01 – 2011-11-02 (×3): 10 mg via ORAL
  Filled 2011-11-01 (×3): qty 2

## 2011-11-01 MED ORDER — SODIUM CHLORIDE 0.9 % IJ SOLN
3.0000 mL | Freq: Two times a day (BID) | INTRAMUSCULAR | Status: DC
  Administered 2011-11-01 – 2011-11-03 (×5): 3 mL via INTRAVENOUS

## 2011-11-01 MED ORDER — ONDANSETRON HCL 4 MG/2ML IJ SOLN: 4.0000 mg | Freq: Four times a day (QID) | INTRAMUSCULAR | Status: DC | PRN

## 2011-11-01 MED ORDER — SODIUM CHLORIDE 0.9 % IV SOLN: 250.0000 mL | INTRAVENOUS | Status: DC | PRN

## 2011-11-01 MED ORDER — DOCUSATE SODIUM 100 MG PO CAPS
200.0000 mg | ORAL_CAPSULE | Freq: Every day | ORAL | Status: DC
  Administered 2011-11-01 – 2011-11-03 (×3): 200 mg via ORAL
  Filled 2011-11-01 (×3): qty 2

## 2011-11-01 MED ORDER — INSULIN ASPART 100 UNIT/ML ~~LOC~~ SOLN
0.0000 [IU] | Freq: Three times a day (TID) | SUBCUTANEOUS | Status: DC
  Filled 2011-11-01: qty 3

## 2011-11-01 MED ORDER — ONDANSETRON HCL 4 MG PO TABS: 4.0000 mg | ORAL_TABLET | Freq: Four times a day (QID) | ORAL | Status: DC | PRN

## 2011-11-01 MED ORDER — PANTOPRAZOLE SODIUM 40 MG PO TBEC
40.0000 mg | DELAYED_RELEASE_TABLET | Freq: Every day | ORAL | Status: DC
  Administered 2011-11-02 – 2011-11-03 (×2): 40 mg via ORAL
  Filled 2011-11-01 (×2): qty 1

## 2011-11-01 MED ORDER — MORPHINE SULFATE 2 MG/ML IJ SOLN
INTRAMUSCULAR | Status: AC
  Administered 2011-11-01: 2 mg via INTRAVENOUS
  Filled 2011-11-01: qty 1

## 2011-11-01 NOTE — Progress Notes (Signed)
UR Completed.  Giavanni Odonovan Jane [phone] 11/01/2011  

## 2011-11-01 NOTE — Progress Notes (Signed)
Pt states she will return home at d/c under care of her partner. Has home health equipment (cane and walker and ramp into house) and lives on 1st floor. Please consult RNCM if O2 is needed at d/c.  Baxter Flattery, MSW [phone]

## 2011-11-01 NOTE — Progress Notes (Signed)
Patient ID: April [name], female   DOB: [birth year], 61 y.o.   MRN: [id number] TCTS DAILY PROGRESS NOTE                   [address]            [place] [address]          [id number]      1 Day Post-Op Procedure(s) (LRB): MEDIASTERNOTOMY (N/A) THYMECTOMY (N/A)  Total Length of Stay:  LOS: 1 day   Subjective: Alert extubated good pain control  Objective: Vital signs in last 24 hours: Temp:  [95.8 F (35.4 C)-98.6 F (37 C)] 97.8 F (36.6 C) (02/21 0723) Pulse Rate:  [54-83] 63  (02/21 0700) Cardiac Rhythm:  [-] Normal sinus rhythm (02/21 0000) Resp:  [11-20] 12  (02/21 0700) BP: (108-147)/(51-73) 119/65 mmHg (02/20 1500) SpO2:  [93 %-100 %] 94 % (02/21 0700) Arterial Line BP: (106-146)/(46-73) 130/61 mmHg (02/21 0700) FiO2 (%):  [3 %-50 %] 3 % (02/20 2000) Weight:  [184 lb 1.4 oz (83.5 kg)-187 lb 13.3 oz (85.2 kg)] 184 lb 1.4 oz (83.5 kg) (02/21 0500)  Filed Weights   10/31/11 0959 10/31/11 1201 11/01/11 0500  Weight: 187 lb 13.3 oz (85.2 kg) 187 lb 6.3 oz (85 kg) 184 lb 1.4 oz (83.5 kg)      Intake/Output from previous day: 02/20 0701 - 02/21 0700 In: 3601.3 [P.O.:540; I.V.:2511.3; IV Piggyback:550] Out: 3605 [Urine:3205; Blood:100; Chest Tube:300]      Current Meds: Scheduled Meds:   . acetaminophen  1,000 mg Oral Q6H   Or  . acetaminophen (TYLENOL) oral liquid 160 mg/5 mL  975 mg Per Tube Q6H  . bisacodyl  10 mg Oral Daily   Or  . bisacodyl  10 mg Rectal Daily  . cefUROXime (ZINACEF)  IV  1.5 g Intravenous 60 min Pre-Op  . cefUROXime (ZINACEF)  IV  1.5 g Intravenous Q12H  . docusate sodium  200 mg Oral Daily  . famotidine (PEPCID) IV  20 mg Intravenous Q12H  . metoprolol tartrate  12.5 mg Oral BID   Or  . metoprolol tartrate  12.5 mg Per Tube BID  . pantoprazole  40 mg Oral Q1200  . sodium chloride  3 mL Intravenous Q12H  . vancomycin (VANCOCIN) IVPB 1000 mg/100 mL central line  1,000 mg Intravenous Once  . DISCONTD: acetaminophen  (TYLENOL) oral liquid 160 mg/5 mL  650 mg Per Tube NOW  . DISCONTD: acetaminophen  650 mg Rectal NOW  . DISCONTD: dexmedetomidine (PRECEDEX) IV infusion for high rates  0.4-1.2 mcg/kg/hr Intravenous To OR   Continuous Infusions:   . sodium chloride 20 mL/hr at 11/01/11 0700  . sodium chloride Stopped (10/31/11 1130)  . sodium chloride    . dexmedetomidine (PRECEDEX) IV infusion    . lactated ringers 20 mL/hr at 11/01/11 0700   PRN Meds:.albumin human, lactated ringers, metoprolol, morphine injection, morphine, ondansetron (ZOFRAN) IV, oxyCODONE, sodium chloride, DISCONTD: 0.9 % irrigation (POUR BTL), DISCONTD: fentaNYL, DISCONTD: HYDROmorphone, DISCONTD: LORazepam, DISCONTD: midazolam, DISCONTD: midazolam, DISCONTD: promethazine, DISCONTD: Surgifoam 1 Gm with 0.9% sodium chloride (4 ml) topical solution  General appearance: alert, cooperative and no distress Neurologic: intact Heart: regular rate and rhythm, S1, S2 normal, no murmur, click, rub or gallop Lungs: clear to auscultation bilaterally Abdomen: soft, non-tender; bowel sounds normal; no masses,  no organomegaly Extremities: extremities normal, atraumatic, no cyanosis or edema, Homans sign is negative, no sign of DVT and no edema, redness  or tenderness in the calves or thighs Wound: sternum stable, no air leak from tubes  Lab Results: CBC: Basename 11/01/11 0320 10/31/11 1344 10/31/11 1230  WBC 11.0* -- 7.8  HGB 11.4* 10.9* --  HCT 33.7* 32.0* --  PLT 210 -- 164   BMET:  Basename 11/01/11 0320 10/31/11 1344 10/29/11 1504  NA 138 140 --  K 4.0 3.7 --  CL 106 -- 103  CO2 26 -- 24  GLUCOSE 115* 178* --  BUN 9 -- 14  CREATININE 0.52 -- 0.60  CALCIUM 9.1 -- 9.4    PT/INR:  Basename 10/29/11 1504  LABPROT 13.6  INR 1.02   Radiology: Dg Chest Portable 1 View  10/31/2011  *RADIOLOGY REPORT*  Clinical Data: 61 year old female status post splenectomy.  PORTABLE CHEST - 1 VIEW  Comparison: Preoperative study 10/29/2011 and  earlier.  Findings: AP portable semi upright view 1305 hours.  Endotracheal tube tip in good position at the level of clavicles.  Right IJ central line catheter tip at the level of the cavoatrial junction. Enteric tube courses to the abdomen, side hole the level of the stomach.  Right side chest tube and mediastinal tube in place.  Mild patchy opacity at the right hilum where previously the right mediastinal mass was noted.  No pneumothorax or pulmonary edema. Other mediastinal contours are within normal limits.  IMPRESSION: 1.  Lines and tubes appear appropriately placed as above. 2.  Low lung volumes.  No pneumothorax.  Mild postoperative patchy opacity at the right hilum.  Original Report Authenticated By: Harley Hallmark, M.D.     Assessment/Plan: S/P Procedure(s) (LRB): MEDIASTERNOTOMY (N/A) THYMECTOMY (N/A) Mobilize d/c tubes/lines Plan for transfer to step-down: see transfer orders Final path pending    Delight Ovens MD  Beeper [phone] Office [phone] 11/01/2011 8:03 AM

## 2011-11-02 ENCOUNTER — Inpatient Hospital Stay (HOSPITAL_COMMUNITY): Payer: BC Managed Care – PPO

## 2011-11-02 LAB — CBC
HCT: 36.5 % (ref 36.0–46.0)
Hemoglobin: 12.3 g/dL (ref 12.0–15.0)
MCH: 28.6 pg (ref 26.0–34.0)
MCHC: 33.7 g/dL (ref 30.0–36.0)
MCV: 84.9 fL (ref 78.0–100.0)
Platelets: 189 10*3/uL (ref 150–400)
RBC: 4.3 MIL/uL (ref 3.87–5.11)
RDW: 14.2 % (ref 11.5–15.5)
WBC: 8.8 10*3/uL (ref 4.0–10.5)

## 2011-11-02 LAB — BASIC METABOLIC PANEL
BUN: 10 mg/dL (ref 6–23)
CO2: 27 mEq/L (ref 19–32)
Calcium: 9.2 mg/dL (ref 8.4–10.5)
Chloride: 102 mEq/L (ref 96–112)
Creatinine, Ser: 0.58 mg/dL (ref 0.50–1.10)
GFR calc Af Amer: >90 mL/min (ref >90)
GFR calc non Af Amer: >90 mL/min (ref >90)
Glucose, Bld: 107 mg/dL — ABNORMAL HIGH (ref 70–99)
Potassium: 3.8 mEq/L (ref 3.5–5.1)
Sodium: 136 mEq/L (ref 135–145)

## 2011-11-02 LAB — GLUCOSE, CAPILLARY: Glucose-Capillary: 110 mg/dL — ABNORMAL HIGH (ref 70–99)

## 2011-11-02 MED ORDER — POTASSIUM CHLORIDE CRYS ER 20 MEQ PO TBCR
20.0000 meq | EXTENDED_RELEASE_TABLET | Freq: Once | ORAL | Status: AC
  Administered 2011-11-02: 20 meq via ORAL
  Filled 2011-11-02: qty 1

## 2011-11-02 MED ORDER — OXYCODONE HCL 5 MG PO TABS: 5.0000 mg | ORAL_TABLET | ORAL | Status: AC | PRN

## 2011-11-02 NOTE — Progress Notes (Signed)
Pt was eating lunch when I walked in, came back 10 minutes later and Pt already up for walk with her partner. Pt walked 500 ft with a steady gait. Denied CP, SOB. Pt was able to walk independently, and was able to get in and out of her chair without assistance. Ed Pt on sternal precautions, restrictions. Pt's partner was in the room during visit. Pt and Partner voiced understanding. Ed complete. [phone] Laurann [place]

## 2011-11-02 NOTE — Discharge Instructions (Signed)
Activity: 1.May walk up steps °               2.No lifting more than ten pounds for four weeks.  °               3.No driving for four weeks. °               4.Stop any activity that causes chest pain, shortness of breath, dizziness, sweating or excessive weakness. °               5.Avoid straining. °               6.Continue with your breathing exercises daily. ° °Diet: Low fat, Low salt diet ° °Wound Care: May shower.  Clean wounds with mild soap and water daily. Contact the office at [phone] if any problems arise. ° ° °

## 2011-11-02 NOTE — Discharge Summary (Signed)
Physician Discharge Summary  Patient ID: April [name] MRN: [id number] DOB/AGE: 63-Feb-1952 63 y.o.  Admit date: 10/31/2011 Discharge date: 11/03/2011  Admission Diagnoses: 1. Anterior mediastinal mass 2.History of hypothyroidism 3.History of hypercholesterolemia 4.History of insomnia 5.History of radiculopathy (lumbar area) 6.History of obesity  Discharge Diagnoses:  1. Anterior mediastinal mass (likely thymoma) 2.History of hypothyroidism 3.History of hypercholesterolemia 4.History of insominia 5.History of radiculopathy (lumbar area) 6.History of obesity   Procedure (s): Median sternotomy with resection of anterior mediastinal tumor (probable well encapsulated thymoma) by Dr. Tyrone Sage on 10/31/2011.   History of Presenting Illness: The patient is a 61 year old female who, after complaining of some cough in late November, was noted to have an abnormal chest x-ray . CT scan was performed that revealed a 7-cm anterior mediastinal  Mass, consistent with thymoma. A needle biopsy was obtained on this tissue suggestive of thymoma. The patient was then referred to Dr. Tyrone Sage for consideration of resection. On review of the scans, the patient did have a significantly enlarged thymic mass, was not obviously invading any tissue, but this could not be determined definitively by CT scan alone. A surgical resection was recommended to the patient. The potential risks , benefits and complications were discussed in detail. The patient wished to go on a trip to [place] before undergoing the surgery. She was admitted to [facility] on 10/31/2011 in order to undergo a median sternotomy for thymectomy.  Brief Hospital Course:  Is extubated evening of surgery without difficulty. She remained afebrile and in a stable. A-line, chest tubes, and Foley were all removed early in her postoperative course. She was felt surgically stable for transfer from the intensive care unit to [facility]. for further convalescence  on 11/01/2011. She's already been tolerating a diet and is passing flatus. Her chest x-ray remain stable. March remains afebrile, hemodynamically stable, and pending morning round evaluation, she will be surgically stable for discharge on 12/01/2011.  Filed Vitals:   11/02/11 0437  BP: 122/75  Pulse: 82  Temp: 100.3 F (37.9 C)  Resp: 20     Latest Vital Signs: Blood pressure 122/75, pulse 82, temperature 100.3 F (37.9 C), temperature source Oral, resp. rate 20, height 5\' 4"  (1.626 m), weight 186 lb 14.4 oz (84.777 kg), SpO2 97.00%.  Physical Exam: Cardiovascular: RRR, no murmurs, gallops, or rubs.  Pulmonary: Clear to auscultation bilaterally; no rales, wheezes, or rhonchi.  Abdomen: Soft, non tender, bowel sounds present.  Extremities: Trace bilateral lower extremity edema.  Wounds: Clean and dry. No erythema or signs of infection.   Discharge Condition:Stable  Recent laboratory studies:  Lab Results  Component Value Date   WBC 8.8 11/02/2011   HGB 12.3 11/02/2011   HCT 36.5 11/02/2011   MCV 84.9 11/02/2011   PLT 189 11/02/2011   Lab Results  Component Value Date   NA 136 11/02/2011   K 3.8 11/02/2011   CL 102 11/02/2011   CO2 27 11/02/2011   CREATININE 0.58 11/02/2011   GLUCOSE 107* 11/02/2011      Diagnostic Studies: Dg Chest 2 View  11/02/2011  *RADIOLOGY REPORT*  Clinical Data: Follow up surgery, mid chest pain  CHEST - 2 VIEW  Comparison: 11/01/2011  Findings: Interval removal of right chest tube, mediastinal drain, and right IJ venous catheter.  No pneumothorax is seen.  Mild right midlung opacity at the site of prior chest tube, likely atelectasis.  No pleural effusion.  Cardiomediastinal silhouette is within normal limits.  Postsurgical changes overlying the right lower mediastinum.  Degenerative changes of the visualized thoracolumbar spine.  Cholecystectomy clips.  IMPRESSION: Interval removal of right chest tube and mediastinal drain.  No pneumothorax is seen.   Original Report Authenticated By: Charline Bills, M.D.    Discharge Orders    Future Appointments: Provider: Department: Dept Phone: Center:   11/19/2011 2:00 PM [facility] [phone] [id number]      Discharge Medications: Medication List  As of 11/02/2011  1:38 PM   TAKE these medications         BETAINE PO   Take 3 tablets by mouth 3 (three) times daily with meals.      COQ10 PO   Take 1 capsule by mouth daily.      IODINE STRONG PO   Take 2 capsules by mouth 2 (two) times daily.      liothyronine 5 MCG tablet   Commonly known as: CYTOMEL   Take 5-10 mcg by mouth 2 (two) times daily. 2 tablets every morning and 1 tablet in the afternoon.      MILK THISTLE PO   Take 1 capsule by mouth daily.      N-ACETYL-L-CYSTEINE PO   Take 1 tablet by mouth daily.      NON FORMULARY   Take 2 capsules by mouth 3 (three) times daily with meals. Ultra nutrient supplement        NON FORMULARY   Take 1 capsule by mouth every evening. Annatto Tocotrienols Supplement          NON FORMULARY   Take 1 tablet by mouth 3 (three) times daily with meals. Primary Digest Supplement.        OVER THE COUNTER MEDICATION   Take 1 capsule by mouth 3 (three) times daily with meals. Digestion GB.      OVER THE COUNTER MEDICATION   Take 1 tablet by mouth 2 (two) times daily. hemagenics iron supplement.      oxyCODONE 5 MG immediate release tablet   Commonly known as: Oxy IR/ROXICODONE   Take 1-2 tablets (5-10 mg total) by mouth every 4 (four) hours as needed for pain.      pravastatin 40 MG tablet   Commonly known as: PRAVACHOL   Take 40 mg by mouth every evening.      Selenium 200 MCG Caps   Take 1 capsule by mouth daily.      Taurine 1000 MG Caps   Take 1 capsule by mouth daily.      thyroid 60 MG tablet   Commonly known as: ARMOUR   Take 90 mg by mouth daily before breakfast.      vitamin A 14782 UNIT capsule   Take 25,000 Units by mouth daily.      vitamin C  1000 MG tablet   Take 1,000 mg by mouth daily.      Vitamin D3 5000 UNITS Caps   Take 1 tablet by mouth daily.      ZINC PO   Take 1 capsule by mouth daily.            Follow Up Appointments: Follow-up Information    Follow up with Charlton Haws, MD. (Call for a follow up appointment for 2-3 weeks)    Contact information:   [address], [address] [place] [address] [phone]       Follow up with Sheliah Plane B, MD. (PA/LAT CXR to be taken on 11/19/2011 at 1:00 pm  ;Appointment with the physician assistant on  11/19/2011 at 2:00 pm)    Contact information:   [address] [place] [address] [phone]          Signed: Doree Fudge MPA-C 11/02/2011, 1:38 PM

## 2011-11-02 NOTE — Progress Notes (Addendum)
2 Days Post-Op Procedure(s) (LRB): MEDIASTERNOTOMY (N/A) THYMECTOMY (N/A)  Subjective: Patient just finished eating breakfast. Passing flatus.No other complaints.  Objective: Vital signs in last 24 hours: Patient Vitals for the past 24 hrs:  BP Temp Temp src Pulse Resp SpO2 Weight  11/02/11 0437 122/75 mmHg 100.3 F (37.9 C) Oral 82  20  97 % 186 lb 14.4 oz (84.777 kg)  11/02/11 0043 - - - 81  16  95 % -  11/01/11 2145 123/62 mmHg 99.2 F (37.3 C) Oral 84  18  94 % -  11/01/11 1833 90/52 mmHg 97.9 F (36.6 C) Oral 75  18  100 % -  11/01/11 1800 143/69 mmHg - - 75  15  99 % -  11/01/11 1700 119/83 mmHg - - 72  16  100 % -  11/01/11 1600 125/69 mmHg - - 64  12  100 % -  11/01/11 1543 - 98 F (36.7 C) Oral - - - -  11/01/11 1500 120/64 mmHg - - 76  19  100 % -  11/01/11 1400 106/53 mmHg - - 73  12  89 % -  11/01/11 1300 106/51 mmHg - - 68  18  95 % -  11/01/11 1208 - 98.6 F (37 C) Oral - - - -  11/01/11 1200 115/59 mmHg - - 70  15  94 % -  11/01/11 1100 110/51 mmHg - - 74  15  96 % -  11/01/11 1000 106/72 mmHg - - 65  12  98 % -  11/01/11 0900 - - - 64  11  98 % -  11/01/11 0800 - - - 65  11  96 % -    Current Weight  11/02/11 186 lb 14.4 oz (84.777 kg)      Intake/Output from previous day: 02/21 0701 - 02/22 0700 In: 900 [P.O.:900] Out: 2690 [Urine:2650; Chest Tube:40]   Physical Exam:  Cardiovascular: RRR, no murmurs, gallops, or rubs. Pulmonary: Clear to auscultation bilaterally; no rales, wheezes, or rhonchi. Abdomen: Soft, non tender, bowel sounds present. Extremities: Trace bilateral lower extremity edema. Wounds: Clean and dry.  No erythema or signs of infection.  Lab Results: CBC: Basename 11/02/11 0509 11/01/11 0320  WBC 8.8 11.0*  HGB 12.3 11.4*  HCT 36.5 33.7*  PLT 189 210   BMET:  Basename 11/02/11 0509 11/01/11 0320  NA 136 138  K 3.8 4.0  CL 102 106  CO2 27 26  GLUCOSE 107* 115*  BUN 10 9  CREATININE 0.58 0.52  CALCIUM 9.2 9.1      PT/INR: No results found for this basename: LABPROT,INR in the last 72 hours ABG:  INR: Will add last result for INR, ABG once components are confirmed Will add last 4 CBG results once components are confirmed  Assessment/Plan:  1. CV - SR.Monitor HR and BP as may need a low dose BB. 2.  Pulmonary - Encourage incentive spirometer.CXR shows no pneumothorax, atelectasis at the right base. 3.Supplement Potassium. 4.Possibly discharge in 1-2 days.   ZIMMERMAN,DONIELLE MPA-C 11/02/2011   I have seen and examined April [name] and agree with the above assessment  and plan.  Delight Ovens MD Beeper [phone] Office [phone] 11/02/2011 4:50 PM

## 2011-11-02 NOTE — Progress Notes (Signed)
Patient ambulated 671ft on room air. Patient tolerated well. No complaints. Encouraged more walking throughout the day. Nickolas Madrid

## 2011-11-02 NOTE — Op Note (Signed)
NAMEMarland Kitchen  JAMYIA, FORTUNE NO.:  [id number]  MEDICAL RECORD NO.:  [id number]  LOCATION:  2017                         FACILITY:  [facility]  PHYSICIAN:  Sheliah Plane, MD    DATE OF BIRTH:  27-Aug-1951  DATE OF PROCEDURE:  10/31/2011 DATE OF DISCHARGE:                              OPERATIVE REPORT   PREOPERATIVE DIAGNOSIS:  Anterior mediastinal mass consistent with thymoma.  POSTOPERATIVE DIAGNOSIS:  Anterior mediastinal mass consistent with thymoma.  SURGICAL PROCEDURE:  Median sternotomy with resection of anterior mediastinal tumor, probable well encapsulated thymoma.  SURGEON:  Sheliah Plane, MD  FIRST ASSISTANT:  Cecile Sheerer, [place].  BRIEF HISTORY:  The patient is a 61 year old female who in late November was noted to have an abnormal chest x-ray after complaining of some cough.  CT scan was performed that revealed a 7-cm anterior mediastinal mass consistent with thymoma.  A needle biopsy was obtained on this tissue suggestive of thymoma.  The patient was then referred to Thoracic Surgery for consideration of resection.  On review of the scans, the patient did have a significantly enlarged thymic mass, was not obviously invading any tissue, but this could be determined definitively by CT scan alone.  A surgical resection was recommended to the patient.  The risks and options were discussed in detail and after a trip to [place], she agreed with proceeding with surgery.  DESCRIPTION OF PROCEDURE:  The patient underwent general endotracheal anesthesia.  Anesthesia did note some difficulty in intubating her because of anatomy, but was able to intubate her without any sequelae. The skin of the chest and legs were prepped with Betadine.  Because of the concern of possible direct invasion into mediastinal structures, we decided to proceed first with partial sternotomy.  With this we were able to palpate the mass but due to its large size and low position, full  sternotomy was completed.  This gave excellent visualization and access to a 7.5-cm thymic mass originating from the right lobe of the thymus and extending down anterior and lateral to the right side of the pericardium.  On examination, the mass was very well encapsulated, did not invade either pericardium or into lung tissue.  It was a fairly discrete narrow pedicle that had arose coming from the thymus.  The surrounding tissue was dissected free, easily freeing the mass.  During this process, the right phrenic nerve was identified and kept from any injury once the superior portion of the mass into the anterior mediastinum was dissected free and the mass was ultimately removed without difficulty and submitted to Pathology.  A right chest tube was placed, and a Blake mediastinal drain left in the anterior mediastinum. The pericardium was not entered and as noted, there was no invasion either into the lung or into pericardium with the operative field hemostatic.  A #6 stainless steel wires were placed in the sternum and the sternum was reapproximated.  Fascia closed with interrupted 0 Vicryl, running 3-0 Vicryl for subcutaneous tissue, 3-0 subcuticular stitch in the skin edges.  Dry dressings were applied.  Because of some difficulty in intubating the patient secondary to sternotomy, we left the patient intubated to go  slowly recovered in the intensive care unit, to make sure she was fully awake before attempting an extubation.  She tolerated the procedure without obvious complication.  Blood loss was minimal.     Sheliah Plane, MD     EG/MEDQ  D:  11/01/2011  T:  11/02/2011  Job:  [id number]

## 2011-11-02 NOTE — Plan of Care (Signed)
Problem: Diagnosis - Type of Surgery Goal: General Surgical Patient Education (See Patient Education module for education specifics) Outcome: Adequate for Discharge Median sternotomy with removal of thymomal mass

## 2011-11-03 MED ORDER — LACTULOSE 10 GM/15ML PO SOLN
20.0000 g | Freq: Once | ORAL | Status: DC
  Filled 2011-11-03: qty 30

## 2011-11-03 NOTE — Progress Notes (Addendum)
3 Days Post-Op Procedure(s) (LRB): MEDIASTERNOTOMY (N/A) THYMECTOMY (N/A)  Subjective: Patient feeling well and really wants to go home.Is passing flatus but has not had a bowel movement yet.  Objective: Vital signs in last 24 hours: Patient Vitals for the past 24 hrs:  BP Temp Temp src Pulse Resp SpO2 Weight  11/03/11 0459 116/71 mmHg 98.6 F (37 C) Oral 78  17  96 % 186 lb 8.2 oz (84.6 kg)  11/02/11 2300 - - - 85  16  95 % -  11/02/11 2025 116/65 mmHg 98.8 F (37.1 C) Oral 87  18  95 % -  11/02/11 1353 121/77 mmHg 99 F (37.2 C) Oral 78  18  98 % -    Current Weight  11/03/11 186 lb 8.2 oz (84.6 kg)      Intake/Output from previous day: 02/22 0701 - 02/23 0700 In: 840 [P.O.:840] Out: 3350 [Urine:3350]   Physical Exam:  Cardiovascular: RRR, no murmurs, gallops, or rubs. Pulmonary: Clear to auscultation bilaterally; no rales, wheezes, or rhonchi. Abdomen: Soft, non tender, bowel sounds present. Extremities: Trace bilateral lower extremity edema. Wound: Clean and dry.  No erythema or signs of infection.  Lab Results: CBC:  Basename 11/02/11 0509 11/01/11 0320  WBC 8.8 11.0*  HGB 12.3 11.4*  HCT 36.5 33.7*  PLT 189 210   BMET:   Basename 11/02/11 0509 11/01/11 0320  NA 136 138  K 3.8 4.0  CL 102 106  CO2 27 26  GLUCOSE 107* 115*  BUN 10 9  CREATININE 0.58 0.52  CALCIUM 9.2 9.1    PT/INR: No results found for this basename: LABPROT,INR in the last 72 hours ABG:  INR: Will add last result for INR, ABG once components are confirmed Will add last 4 CBG results once components are confirmed  Assessment/Plan:  1. CV - SR. 2.  Pulmonary - Encourage incentive spirometer. 3.LOC for constipation. 4.Probable discharge later today.     ZIMMERMAN,DONIELLE MPA-C 11/03/2011  11/03/2011 7:49 AM   Feels well plan dc today  I have seen and examined Jenna Luo and agree with the above assessment  and plan.  Delight Ovens MD Beeper [phone] Office  [phone] 11/03/2011 12:17 PM

## 2011-11-03 NOTE — Progress Notes (Signed)
Pt. Discharged 11/03/2011  1:26 PM Discharge instructions reviewed with patient/family. Patient/family verbalized understanding. All Rx's given. Questions answered as needed. Pt. Discharged to home with family/self. Taken off unit via W/C. Medina Degraffenreid

## 2011-11-04 ENCOUNTER — Encounter (HOSPITAL_COMMUNITY): Payer: Self-pay | Admitting: Cardiothoracic Surgery

## 2011-11-09 ENCOUNTER — Ambulatory Visit (INDEPENDENT_AMBULATORY_CARE_PROVIDER_SITE_OTHER): Payer: Self-pay

## 2011-11-09 DIAGNOSIS — R222 Localized swelling, mass and lump, trunk: Secondary | ICD-10-CM

## 2011-11-09 DIAGNOSIS — Z4802 Encounter for removal of sutures: Secondary | ICD-10-CM

## 2011-11-09 NOTE — Progress Notes (Signed)
Removed 2 sutures from chest tube sites, no signs of infection and pt tolerated well. There is some mild erythema at lower need of sternal incision but no drainage. Post op appt sch'ed for 11/19/11 with PA.

## 2011-11-14 ENCOUNTER — Other Ambulatory Visit: Payer: Self-pay | Admitting: Cardiothoracic Surgery

## 2011-11-14 DIAGNOSIS — R222 Localized swelling, mass and lump, trunk: Secondary | ICD-10-CM

## 2011-11-19 ENCOUNTER — Ambulatory Visit
Admission: RE | Admit: 2011-11-19 | Discharge: 2011-11-19 | Disposition: A | Discharge status: Home / Self Care | Payer: BC Managed Care – PPO | Source: Ambulatory Visit | Attending: Cardiothoracic Surgery | Admitting: Cardiothoracic Surgery

## 2011-11-19 ENCOUNTER — Ambulatory Visit (INDEPENDENT_AMBULATORY_CARE_PROVIDER_SITE_OTHER): Payer: Self-pay | Admitting: Physician Assistant

## 2011-11-19 ENCOUNTER — Encounter: Payer: Self-pay | Admitting: Physician Assistant

## 2011-11-19 VITALS — BP 120/76 | HR 76 | Resp 16 | Ht 64.0 in | Wt 180.0 lb

## 2011-11-19 DIAGNOSIS — Z09 Encounter for follow-up examination after completed treatment for conditions other than malignant neoplasm: Secondary | ICD-10-CM

## 2011-11-19 DIAGNOSIS — D150 Benign neoplasm of thymus: Secondary | ICD-10-CM

## 2011-11-19 DIAGNOSIS — R222 Localized swelling, mass and lump, trunk: Secondary | ICD-10-CM

## 2011-11-19 NOTE — Progress Notes (Addendum)
HPI:  Patient returns for routine postoperative follow-up having undergone a median sternotomy for resection of anterior mediastinal mass by Dr. Tyrone Sage on 10/31/2011. Since hospital discharge the patient reports that she has no complaints. She walks daily. She denies any shortness of breath or chest pain. She does  have minor incisional pain at the proximal portion of her sternal wound.   Current Outpatient Prescriptions  Medication Sig Dispense Refill  . Acetylcysteine (N-ACETYL-L-CYSTEINE PO) Take 1 tablet by mouth daily.      . Ascorbic Acid (VITAMIN C) 1000 MG tablet Take 1,000 mg by mouth daily.        Marland Kitchen BETAINE PO Take 3 tablets by mouth 3 (three) times daily with meals.       . Cholecalciferol (VITAMIN D3) 5000 UNITS CAPS Take 1 tablet by mouth daily.       . Coenzyme Q10 (COQ10 PO) Take 1 capsule by mouth daily.      . Iodine Strong, Lugols, (IODINE STRONG PO) Take 2 capsules by mouth 2 (two) times daily.      Marland Kitchen liothyronine (CYTOMEL) 5 MCG tablet Take 5-10 mcg by mouth 2 (two) times daily. 2 tablets every morning and 1 tablet in the afternoon.      Marland Kitchen MILK THISTLE PO Take 1 capsule by mouth daily.       . Multiple Vitamins-Minerals (ZINC PO) Take 1 capsule by mouth daily.       . NON FORMULARY Take 2 capsules by mouth 3 (three) times daily with meals. Ultra nutrient supplement       . NON FORMULARY Take 1 capsule by mouth every evening. Annatto Tocotrienols Supplement        . NON FORMULARY Take 1 tablet by mouth 3 (three) times daily with meals. Primary Digest Supplement.       Marland Kitchen OVER THE COUNTER MEDICATION Take 1 capsule by mouth 3 (three) times daily with meals. Digestion GB.      Marland Kitchen OVER THE COUNTER MEDICATION Take 1 tablet by mouth 2 (two) times daily. hemagenics iron supplement.      . pravastatin (PRAVACHOL) 40 MG tablet Take 40 mg by mouth every evening.       . Selenium 200 MCG CAPS Take 1 capsule by mouth daily.       . Taurine 1000 MG CAPS Take 1 capsule by mouth  daily.       Marland Kitchen thyroid (ARMOUR) 60 MG tablet Take 90 mg by mouth daily before breakfast.       . vitamin A 60454 UNIT capsule Take 25,000 Units by mouth daily.       Vital Signs: BP 120/76, heart rate 76, respiration rate 16, O2 sat 100% on room air  Physical Exam: Cardiovascular: RRR. Pulmonary: Clear to auscultation;no wheezes, rhonchi. rate and rhythm. Abdomen: Soft, nontender, bowel sounds present. Extremities: No cyanosis, clubbing, or edema. Wound: Clean, dry, no signs of infection. Minor tenderness proximal portion of sternal wound. Sternum is solid.  Diagnostic Tests: PA lateral chest x-ray done today showed improved aeration at the bases, resolution of previously seen atelectasis, no pneumothorax, and possible tiny right pleural effusion.  Impression and Plan: Overall the patient continued to progress well. Final pathology indicated this is an 8.2 cm thymoma. She is not taking any narcotics for pain. She was instructed she may begin driving short distances i.e. 30 minutes or less during the day and gradually increase her frequency and duration as tolerates. She was instructed to continue with sternal precautions  i.e. no lifting more than 10 pounds for the next 4-6 weeks. The patient does have several questions regarding radiation exposure related to  the CAT scans as well as how many CAT scans and for how long. I will speak with Dr. Tyrone Sage and will contact the patient tomorrow with answers to these questions. To return to see Dr. Tyrone Sage in a couple of weeks.

## 2011-12-03 ENCOUNTER — Encounter (HOSPITAL_COMMUNITY): Payer: Self-pay

## 2011-12-24 ENCOUNTER — Encounter: Payer: Self-pay | Admitting: Cardiovascular Disease

## 2011-12-24 ENCOUNTER — Ambulatory Visit (INDEPENDENT_AMBULATORY_CARE_PROVIDER_SITE_OTHER): Payer: BC Managed Care – PPO | Admitting: Cardiovascular Disease

## 2011-12-24 VITALS — BP 122/82 | HR 76 | Ht 64.0 in | Wt 191.0 lb

## 2011-12-24 DIAGNOSIS — R222 Localized swelling, mass and lump, trunk: Secondary | ICD-10-CM

## 2011-12-24 DIAGNOSIS — E039 Hypothyroidism, unspecified: Secondary | ICD-10-CM

## 2011-12-24 DIAGNOSIS — J9859 Other diseases of mediastinum, not elsewhere classified: Secondary | ICD-10-CM

## 2011-12-24 NOTE — Patient Instructions (Signed)
Your physician recommends that you schedule a follow-up appointment in: AS NEEDED  Your physician recommends that you continue on your current medications as directed. Please refer to the Current Medication list given to you today.  

## 2011-12-24 NOTE — Assessment & Plan Note (Signed)
S/P resection F/U CXR ok.  No need for adjuvant Rx  F/U Tyrone Sage

## 2011-12-24 NOTE — Assessment & Plan Note (Signed)
Continue replacement. TSH with Dr Alessandra Bevels

## 2011-12-24 NOTE — Progress Notes (Signed)
Patient returns for routine postoperative follow-up having undergone a median sternotomy for resection of anterior mediastinal mass by Dr. Tyrone Sage on 10/31/2011.  Since hospital discharge the patient reports that she has no complaints. She walks daily. She denies any shortness of breath or chest pain. She does  have minor incisional pain at the proximal portion of her sternal wound.   ROS: Denies fever, malais, weight loss, blurry vision, decreased visual acuity, cough, sputum, SOB, hemoptysis, pleuritic pain, palpitaitons, heartburn, abdominal pain, melena, lower extremity edema, claudication, or rash.  All other systems reviewed and negative  General: Affect appropriate Healthy:  appears stated age HEENT: normal Neck supple with no adenopathy JVP normal no bruits no thyromegaly Lungs clear with no wheezing and good diaphragmatic motion Heart:  S1/S2 no murmur, no rub, gallop or click PMI normal Abdomen: benighn, BS positve, no tenderness, no AAA no bruit.  No HSM or HJR Distal pulses intact with no bruits No edema Neuro non-focal Skin warm and dry No muscular weakness   Current Outpatient Prescriptions  Medication Sig Dispense Refill  . Acetylcysteine (N-ACETYL-L-CYSTEINE PO) Take 1 tablet by mouth daily.      . Ascorbic Acid (VITAMIN C) 1000 MG tablet Take 1,000 mg by mouth daily.        Marland Kitchen BETAINE PO Take 3 tablets by mouth 3 (three) times daily with meals.       . Cholecalciferol (VITAMIN D3) 5000 UNITS CAPS Take 1 tablet by mouth daily.       . Coenzyme Q10 (COQ10 PO) Take 1 capsule by mouth daily.      . Iodine Strong, Lugols, (IODINE STRONG PO) Take 2 capsules by mouth 2 (two) times daily.      Marland Kitchen liothyronine (CYTOMEL) 5 MCG tablet 2 tablets every morning and 1 tablet in the afternoon.      Marland Kitchen MILK THISTLE PO Take 1 capsule by mouth daily.       . Multiple Vitamins-Minerals (ZINC PO) Take 1 capsule by mouth daily.       . NON FORMULARY Take 2 capsules by mouth 3 (three)  times daily with meals. Ultra nutrient supplement       . NON FORMULARY Take 1 capsule by mouth every evening. Annatto Tocotrienols Supplement        . NON FORMULARY Take 1 tablet by mouth 3 (three) times daily with meals. Primary Digest Supplement.       Marland Kitchen OVER THE COUNTER MEDICATION Take 1 capsule by mouth 3 (three) times daily with meals. Digestion GB.      Marland Kitchen OVER THE COUNTER MEDICATION Take 1 tablet by mouth 2 (two) times daily. hemagenics iron supplement.      . pravastatin (PRAVACHOL) 40 MG tablet Take 40 mg by mouth every evening.       . Selenium 200 MCG CAPS Take 1 capsule by mouth daily.       . Taurine 1000 MG CAPS Take 1 capsule by mouth daily.       Marland Kitchen thyroid (ARMOUR) 60 MG tablet Take 90 mg by mouth daily before breakfast.       . vitamin A 16109 UNIT capsule Take 25,000 Units by mouth daily.         Allergies  Review of patient's allergies indicates no known allergies.  Electrocardiogram:  Assessment and Plan

## 2012-01-03 ENCOUNTER — Encounter: Payer: Self-pay | Admitting: Cardiothoracic Surgery

## 2012-01-03 ENCOUNTER — Ambulatory Visit (INDEPENDENT_AMBULATORY_CARE_PROVIDER_SITE_OTHER): Payer: Self-pay | Admitting: Cardiothoracic Surgery

## 2012-01-03 VITALS — BP 109/72 | HR 72 | Temp 97.8°F | Resp 16 | Ht 64.0 in | Wt 192.0 lb

## 2012-01-03 DIAGNOSIS — D150 Benign neoplasm of thymus: Secondary | ICD-10-CM

## 2012-01-03 NOTE — Progress Notes (Signed)
[address]            [place] [address]          [phone]     HPI:  Patient returns for routine postoperative follow-up having undergone a median sternotomy for resection of anterior mediastinal mass by  on 10/31/2011. Since hospital discharge the patient reports that she has no complaints. She walks daily. She denies any shortness of breath or chest pain. She does  have minor incisional pain at the proximal portion of her sternal wound.   Current Outpatient Prescriptions  Medication Sig Dispense Refill  . Acetylcysteine (N-ACETYL-L-CYSTEINE PO) Take 1 tablet by mouth daily.      . Ascorbic Acid (VITAMIN C) 1000 MG tablet Take 1,000 mg by mouth daily.        Marland Kitchen BETAINE PO Take 3 tablets by mouth 3 (three) times daily with meals.       . Cholecalciferol (VITAMIN D3) 5000 UNITS CAPS Take 1 tablet by mouth daily.       . Coenzyme Q10 (COQ10 PO) Take 1 capsule by mouth daily.      . Iodine Strong, Lugols, (IODINE STRONG PO) Take 2 capsules by mouth 2 (two) times daily.      Marland Kitchen liothyronine (CYTOMEL) 5 MCG tablet 2 tablets every morning and 1 tablet in the afternoon.      Marland Kitchen MILK THISTLE PO Take 1 capsule by mouth daily.       . Multiple Vitamins-Minerals (ZINC PO) Take 1 capsule by mouth daily.       . NON FORMULARY Take 2 capsules by mouth 3 (three) times daily with meals. Ultra nutrient supplement       . NON FORMULARY Take 1 capsule by mouth every evening. Annatto Tocotrienols Supplement        . NON FORMULARY Take 1 tablet by mouth 3 (three) times daily with meals. Primary Digest Supplement.       Marland Kitchen OVER THE COUNTER MEDICATION Take 1 capsule by mouth 3 (three) times daily with meals. Digestion GB.      Marland Kitchen OVER THE COUNTER MEDICATION Take 1 tablet by mouth 2 (two) times daily. hemagenics iron supplement.      . pravastatin (PRAVACHOL) 40 MG tablet Take 40 mg by mouth every evening.       . Selenium 200 MCG CAPS Take 1 capsule by mouth daily.         . Taurine 1000 MG CAPS Take 1 capsule by mouth daily.       Marland Kitchen thyroid (ARMOUR) 60 MG tablet Take 90 mg by mouth daily before breakfast.       . vitamin A 21308 UNIT capsule Take 25,000 Units by mouth daily.       Vital Signs: BP 120/76, heart rate 76, respiration rate 16, O2 sat 100% on room air  Physical Exam: Cardiovascular: RRR. Pulmonary: Clear to auscultation;no wheezes, rhonchi. rate and rhythm. Abdomen: Soft, nontender, bowel sounds present. Extremities: No cyanosis, clubbing, or edema. Wound: Clean, dry, no signs of infection. Minor tenderness proximal portion of sternal wound. Sternum is solid.  Diagnostic Tests: PA lateral chest x-ray done today showed improved aeration at the bases, resolution of previously seen atelectasis, no pneumothorax, and possible tiny right pleural effusion.  Impression and Plan: Overall the patient continued to progress well. Final pathology indicated this is an 8.2 cm thymoma encapsulated .  She is not taking any narcotics for pain.  I discussed with her the pathologic findings, an operative appearance that the tumor is likely not malignant as there was no evidence of invasion and was removed as well encapsulated thymoma. She's concerned about radiation from further x-ray studies, I have recommended to her that in approximately 8 months we'll obtain a followup CT scan of the chest. I assured her that we would not do any unnecessary CT scans to increase her radiation dose. Otherwise she appears to be doing well following her surgical resection.

## 2012-03-21 ENCOUNTER — Encounter: Payer: Self-pay | Admitting: Gastroenterology

## 2012-04-04 ENCOUNTER — Ambulatory Visit (INDEPENDENT_AMBULATORY_CARE_PROVIDER_SITE_OTHER): Payer: BC Managed Care – PPO | Admitting: Family Medicine

## 2012-04-04 VITALS — BP 128/68 | HR 106 | Resp 18 | Ht 64.0 in | Wt 190.0 lb

## 2012-04-04 DIAGNOSIS — L255 Unspecified contact dermatitis due to plants, except food: Secondary | ICD-10-CM

## 2012-04-04 DIAGNOSIS — L237 Allergic contact dermatitis due to plants, except food: Secondary | ICD-10-CM

## 2012-04-04 MED ORDER — PREDNISONE 20 MG PO TABS: ORAL_TABLET | ORAL | Status: DC

## 2012-04-04 NOTE — Progress Notes (Signed)
@[email or web address] @  Patient ID: April [name] MRN: [id number], DOB: Mar 18, 1951, 61 y.o. Date of Encounter: 04/04/2012, 9:01 AM  Primary Physician: Mia Creek, MD  Chief Complaint: Pruritic rash  HPI: 61 y.o. year old female with presents with 4 day history of mildly erythematous pruritic rash along arms and legs. Patient was doing yard work prior to the development of the rash. Known poison ivy in the vicinity. Has not yet washed all clothing or linens that have been exposed. Lesions now weeping clear fluid. Has tried hot baths, calamine.  Patient is otherwise doing well without issues or complaints.  Past Medical History  Diagnosis Date  . Obesity   . Hypothyroidism   . Insomnia   . Mineral deficiency   . Hypochlorhydria   . Hypercholesteremia   . Cataract   . Vitamin d deficiency   . Fatigue   . Candidiasis of intestine   . Vitamin d deficiency   . Radiculopathy, lumbar region   . Bronchitis   . Sleep apnea     ?? SETTING  . Recurrent upper respiratory infection (URI)     COLD IN JAN....STILL HAS OCCAS. COUGHING     Home Meds: Prior to Admission medications   Medication Sig Start Date End Date Taking? Authorizing Provider  Ascorbic Acid (VITAMIN C) 1000 MG tablet Take 1,000 mg by mouth daily.     Yes Historical Provider, MD  BETAINE PO Take 3 tablets by mouth 3 (three) times daily with meals.    Yes Historical Provider, MD  Cholecalciferol (VITAMIN D3) 5000 UNITS CAPS Take 1 tablet by mouth daily.    Yes Historical Provider, MD  Coenzyme Q10 (COQ10 PO) Take 1 capsule by mouth daily.   Yes Historical Provider, MD  Iodine Strong, Lugols, (IODINE STRONG PO) Take 2 capsules by mouth 2 (two) times daily.   Yes Historical Provider, MD  liothyronine (CYTOMEL) 5 MCG tablet 2 tablets every morning and 1 tablet in the afternoon.   Yes Historical Provider, MD  MILK THISTLE PO Take 1 capsule by mouth daily.    Yes Historical Provider, MD  Multiple Vitamins-Minerals (ZINC PO) Take 1  capsule by mouth daily.    Yes Historical Provider, MD  NON FORMULARY Take 2 capsules by mouth 3 (three) times daily with meals. Ultra nutrient supplement    Yes Historical Provider, MD  NON FORMULARY Take 1 capsule by mouth every evening. Annatto Tocotrienols Supplement     Yes Historical Provider, MD  NON FORMULARY Take 1 tablet by mouth 3 (three) times daily with meals. Primary Digest Supplement.    Yes Historical Provider, MD  OVER THE COUNTER MEDICATION Take 1 capsule by mouth 3 (three) times daily with meals. Digestion GB.   Yes Historical Provider, MD  OVER THE COUNTER MEDICATION Take 1 tablet by mouth 2 (two) times daily. hemagenics iron supplement.   Yes Historical Provider, MD  pravastatin (PRAVACHOL) 40 MG tablet Take 40 mg by mouth every evening.    Yes Historical Provider, MD  Selenium 200 MCG CAPS Take 1 capsule by mouth daily.    Yes Historical Provider, MD  Taurine 1000 MG CAPS Take 1 capsule by mouth daily.    Yes Historical Provider, MD  thyroid (ARMOUR) 60 MG tablet Take 90 mg by mouth daily before breakfast.    Yes Historical Provider, MD  vitamin A 40981 UNIT capsule Take 25,000 Units by mouth daily.    Yes Historical Provider, MD  Acetylcysteine (N-ACETYL-L-CYSTEINE PO) Take 1 tablet by mouth daily.  Historical Provider, MD    Allergies: No Known Allergies  History   Social History  . Marital Status: Single    Spouse Name: N/A    Number of Children: N/A  . Years of Education: N/A   Occupational History  . Not on file.   Social History Main Topics  . Smoking status: Former Smoker -- 1.0 packs/day for 17 years    Types: Cigarettes    Quit date: 09/26/1983  . Smokeless tobacco: Not on file  . Alcohol Use: No  . Drug Use: No  . Sexually Active: Not on file   Other Topics Concern  . Not on file   Social History Narrative  . No narrative on file     Review of Systems: Constitutional: negative for chills, fever, night sweats, weight changes, or fatigue   HEENT: negative for vision changes, hearing loss, congestion, rhinorrhea, ST, epistaxis, or sinus pressure Cardiovascular: negative for chest pain or palpitations Respiratory: negative for hemoptysis, wheezing, shortness of breath, or cough Abdominal: negative for abdominal pain, nausea, vomiting, diarrhea, or constipation Dermatological: see above Neurologic: negative for headache, dizziness, or syncope   Physical Exam: Blood pressure 128/68, pulse 106, resp. rate 18, height 5\' 4"  (1.626 m), weight 190 lb (86.183 kg), SpO2 97.00%., Body mass index is 32.61 kg/(m^2). General: Well developed, well nourished, in no acute distress. Head: Normocephalic, atraumatic, eyes without discharge, sclera non-icteric, nares are without discharge. Bilateral auditory canals clear, TM's are without perforation, pearly grey and translucent with reflective cone of light bilaterally. Oral cavity moist, posterior pharynx without exudate, erythema, peritonsillar abscess, or post nasal drip.  Neck: Supple. No thyromegaly. Full ROM. No lymphadenopathy. Lungs: Clear bilaterally to auscultation without wheezes, rales, or rhonchi. Breathing is unlabored. Heart: RRR with S1 S2. No murmurs, rubs, or gallops appreciated. Msk:  Strength and tone normal for age. Extremities/Skin: Warm and dry. Multiple vesicular weeping lesions along forearms and legs consistent with poison ivy. No secondary infections present. No clubbing or cyanosis. No edema.  Neuro: Alert and oriented X 3. Moves all extremities spontaneously. Gait is normal. CNII-XII grossly in tact. Psych:  Responds to questions appropriately with a normal affect.   Labs:   ASSESSMENT AND PLAN:  61 y.o. year old female with poison ivy -Prednisone 20 mg #9 1. Poison ivy  predniSONE (DELTASONE) 20 MG tablet    -Zyrtec -Zantac -Benadryl -Wash all contaminated clothes and linens -RTC 10 days if symptoms persist, sooner if they worsen   Signed, Elvina Sidle, MD 04/04/2012 9:01 AM

## 2012-05-19 ENCOUNTER — Ambulatory Visit (AMBULATORY_SURGERY_CENTER): Payer: BC Managed Care – PPO | Admitting: *Deleted

## 2012-05-19 VITALS — Ht 64.0 in | Wt 191.2 lb

## 2012-05-19 DIAGNOSIS — Z1211 Encounter for screening for malignant neoplasm of colon: Secondary | ICD-10-CM

## 2012-05-19 MED ORDER — MOVIPREP 100 G PO SOLR: ORAL | Status: DC

## 2012-05-20 ENCOUNTER — Encounter: Payer: Self-pay | Admitting: Gastroenterology

## 2012-05-29 ENCOUNTER — Encounter: Payer: BC Managed Care – PPO | Admitting: Gastroenterology

## 2012-06-09 ENCOUNTER — Telehealth: Payer: Self-pay

## 2012-06-09 NOTE — Telephone Encounter (Signed)
Message copied by Annett Fabian on Mon Jun 09, 2012  2:48 PM ------      Message from: Claudette Head T      Created: Fri Jun 06, 2012  1:41 PM      Regarding: RE: ASA IV patient       Hospital week case on Mon, Tues or Wed. If she wants another MD for a more convenient time that is ok with me.             ----- Message -----         From: Rossie Muskrat, RN,CGRN         Sent: 06/06/2012  12:59 PM           To: Meryl Dare, MD,FACG      Subject: RE: ASA IV patient                                       Under the history tab there is a comment about anesthesia complications.  Best I can tell this is a direct schedule      ----- Message -----         From: Meryl Dare, MD,FACG         Sent: 06/06/2012   9:20 AM           To: Rossie Muskrat, RN,CGRN      Subject: FW: ASA IV patient                                       I can't find any information on this patient. Is this my patient? Was she seen in previsit? Where is info on difficult intubation?                   ----- Message -----         From: Cathlyn Parsons, CRNA         Sent: 06/05/2012  11:00 PM           To: Meryl Dare, MD,FACG, Jannifer Franklin, RN, #      Subject: ASA IV patient                                           Dr. Russella Dar,            The above patient was seen in pre-screening and scheduled, but is a known difficult intubation.  Consequently, her procedure would be best done at the hospital.            Thanks for your consideration in this matter.            Cathlyn Parsons CRNA

## 2012-06-09 NOTE — Telephone Encounter (Signed)
Patient advised, she would like to wait until December.  She is advised that I will contact her with the December schedule for Dr. Russella Dar come out.  I have cancelled the appt for 06/12/12

## 2012-06-12 ENCOUNTER — Encounter: Payer: BC Managed Care – PPO | Admitting: Gastroenterology

## 2012-07-11 ENCOUNTER — Telehealth: Payer: Self-pay

## 2012-07-11 ENCOUNTER — Other Ambulatory Visit: Payer: Self-pay

## 2012-07-11 DIAGNOSIS — Z1211 Encounter for screening for malignant neoplasm of colon: Secondary | ICD-10-CM

## 2012-07-11 DIAGNOSIS — Z139 Encounter for screening, unspecified: Secondary | ICD-10-CM

## 2012-07-11 NOTE — Telephone Encounter (Signed)
Message copied by Annett Fabian on Fri Jul 11, 2012  4:06 PM ------      Message from: Annett Fabian      Created: Mon Jun 09, 2012  2:56 PM       Needs colon at hospital week in Dec ASA IV.  See phone note from 06/09/12

## 2012-07-11 NOTE — Telephone Encounter (Signed)
Patient is scheduled for 08/25/12 10:00 [facility].  I have mailed her new instructions.

## 2012-07-21 ENCOUNTER — Other Ambulatory Visit: Payer: Self-pay | Admitting: Cardiothoracic Surgery

## 2012-08-25 ENCOUNTER — Encounter (HOSPITAL_COMMUNITY)
Admission: RE | Disposition: A | Discharge status: Home / Self Care | Payer: Self-pay | Source: Ambulatory Visit | Attending: Gastroenterology

## 2012-08-25 ENCOUNTER — Encounter (HOSPITAL_COMMUNITY): Payer: Self-pay | Admitting: Gastroenterology

## 2012-08-25 ENCOUNTER — Ambulatory Visit (HOSPITAL_COMMUNITY)
Admission: RE | Admit: 2012-08-25 | Discharge: 2012-08-25 | Disposition: A | Discharge status: Home / Self Care | Payer: BC Managed Care – PPO | Source: Ambulatory Visit | Attending: Gastroenterology | Admitting: Gastroenterology

## 2012-08-25 DIAGNOSIS — Z86010 Personal history of colonic polyps: Secondary | ICD-10-CM

## 2012-08-25 DIAGNOSIS — D126 Benign neoplasm of colon, unspecified: Secondary | ICD-10-CM

## 2012-08-25 DIAGNOSIS — K5730 Diverticulosis of large intestine without perforation or abscess without bleeding: Secondary | ICD-10-CM | POA: Insufficient documentation

## 2012-08-25 DIAGNOSIS — G4730 Sleep apnea, unspecified: Secondary | ICD-10-CM | POA: Insufficient documentation

## 2012-08-25 DIAGNOSIS — D125 Benign neoplasm of sigmoid colon: Secondary | ICD-10-CM

## 2012-08-25 DIAGNOSIS — D375 Neoplasm of uncertain behavior of rectum: Secondary | ICD-10-CM | POA: Insufficient documentation

## 2012-08-25 DIAGNOSIS — Z860101 Personal history of adenomatous and serrated colon polyps: Secondary | ICD-10-CM

## 2012-08-25 DIAGNOSIS — Z1211 Encounter for screening for malignant neoplasm of colon: Secondary | ICD-10-CM | POA: Insufficient documentation

## 2012-08-25 DIAGNOSIS — E039 Hypothyroidism, unspecified: Secondary | ICD-10-CM | POA: Insufficient documentation

## 2012-08-25 DIAGNOSIS — D371 Neoplasm of uncertain behavior of stomach: Secondary | ICD-10-CM | POA: Insufficient documentation

## 2012-08-25 DIAGNOSIS — E7800 Pure hypercholesterolemia, unspecified: Secondary | ICD-10-CM | POA: Insufficient documentation

## 2012-08-25 HISTORY — PX: COLONOSCOPY: SHX5424

## 2012-08-25 SURGERY — COLONOSCOPY
Anesthesia: Moderate Sedation

## 2012-08-25 MED ORDER — SODIUM CHLORIDE 0.9 % IV SOLN
INTRAVENOUS | Status: DC
  Administered 2012-08-25: 500 mL via INTRAVENOUS

## 2012-08-25 MED ORDER — MIDAZOLAM HCL 10 MG/2ML IJ SOLN
INTRAMUSCULAR | Status: AC
  Filled 2012-08-25: qty 2

## 2012-08-25 MED ORDER — FENTANYL CITRATE 0.05 MG/ML IJ SOLN
INTRAMUSCULAR | Status: DC | PRN
  Administered 2012-08-25 (×2): 25 ug via INTRAVENOUS

## 2012-08-25 MED ORDER — MIDAZOLAM HCL 5 MG/5ML IJ SOLN
INTRAMUSCULAR | Status: DC | PRN
  Administered 2012-08-25: 1 mg via INTRAVENOUS
  Administered 2012-08-25 (×2): 2 mg via INTRAVENOUS

## 2012-08-25 MED ORDER — FENTANYL CITRATE 0.05 MG/ML IJ SOLN
INTRAMUSCULAR | Status: AC
  Filled 2012-08-25: qty 4

## 2012-08-25 NOTE — H&P (Signed)
Patient ID: April [name]  MRN: [id number], DOB: [birth year]  Date of Encounter: 08/25/12 Primary Physician: Mia Creek, MD  Chief Complaint: Colorectal cancer screening HPI: 61 y.o. year old female with no GI complaints for CRC screening colonoscopy. Had difficulty with intubation in the past. Denies weight loss, abdominal pain, constipation, diarrhea, change in stool caliber, melena, hematochezia, nausea, vomiting, dysphagia, reflux symptoms, chest pain.   Past Medical History   Diagnosis  Date   .  Obesity    .  Hypothyroidism    .  Insomnia    .  Mineral deficiency    .  Hypochlorhydria    .  Hypercholesteremia    .  Cataract    .  Vitamin d deficiency    .  Fatigue    .  Candidiasis of intestine    .  Vitamin d deficiency    .  Radiculopathy, lumbar region    .  Bronchitis    .  Sleep apnea      ?? SETTING   .  Recurrent upper respiratory infection (URI)      COLD IN JAN....STILL HAS OCCAS. COUGHING    Home Meds:  Prior to Admission medications   Medication  Sig  Start Date  End Date  Taking?  Authorizing Provider   Ascorbic Acid (VITAMIN C) 1000 MG tablet  Take 1,000 mg by mouth daily.    Yes  Historical Provider, MD   BETAINE PO  Take 3 tablets by mouth 3 (three) times daily with meals.    Yes  Historical Provider, MD   Cholecalciferol (VITAMIN D3) 5000 UNITS CAPS  Take 1 tablet by mouth daily.    Yes  Historical Provider, MD   Coenzyme Q10 (COQ10 PO)  Take 1 capsule by mouth daily.    Yes  Historical Provider, MD   Iodine Strong, Lugols, (IODINE STRONG PO)  Take 2 capsules by mouth 2 (two) times daily.    Yes  Historical Provider, MD   liothyronine (CYTOMEL) 5 MCG tablet  2 tablets every morning and 1 tablet in the afternoon.    Yes  Historical Provider, MD   MILK THISTLE PO  Take 1 capsule by mouth daily.    Yes  Historical Provider, MD   Multiple Vitamins-Minerals (ZINC PO)  Take 1 capsule by mouth daily.    Yes  Historical Provider, MD   NON FORMULARY  Take 2  capsules by mouth 3 (three) times daily with meals. Ultra nutrient supplement    Yes  Historical Provider, MD   NON FORMULARY  Take 1 capsule by mouth every evening. Annatto Tocotrienols Supplement    Yes  Historical Provider, MD   NON FORMULARY  Take 1 tablet by mouth 3 (three) times daily with meals. Primary Digest Supplement.    Yes  Historical Provider, MD   OVER THE COUNTER MEDICATION  Take 1 capsule by mouth 3 (three) times daily with meals. Digestion GB.    Yes  Historical Provider, MD   OVER THE COUNTER MEDICATION  Take 1 tablet by mouth 2 (two) times daily. hemagenics iron supplement.    Yes  Historical Provider, MD   pravastatin (PRAVACHOL) 40 MG tablet  Take 40 mg by mouth every evening.    Yes  Historical Provider, MD   Selenium 200 MCG CAPS  Take 1 capsule by mouth daily.    Yes  Historical Provider, MD   Taurine 1000 MG CAPS  Take 1 capsule by mouth daily.  Yes  Historical Provider, MD   thyroid (ARMOUR) 60 MG tablet  Take 90 mg by mouth daily before breakfast.    Yes  Historical Provider, MD   vitamin A 08657 UNIT capsule  Take 25,000 Units by mouth daily.    Yes  Historical Provider, MD   Acetylcysteine (N-ACETYL-L-CYSTEINE PO)  Take 1 tablet by mouth daily.     Historical Provider, MD   Allergies: No Known Allergies  History    Social History   .  Marital Status:  Single     Spouse Name:  N/A     Number of Children:  N/A   .  Years of Education:  N/A    Occupational History   .  Not on file.    Social History Main Topics   .  Smoking status:  Former Smoker -- 1.0 packs/day for 17 years     Types:  Cigarettes     Quit date:  09/26/1983   .  Smokeless tobacco:  Not on file   .  Alcohol Use:  No   .  Drug Use:  No   .  Sexually Active:  Not on file    Other Topics  Concern   .  Not on file    Social History Narrative   .  No narrative on file    Review of Systems:  As in HPI and as follows: Constitutional: negative for chills, fever, night sweats, weight  changes, or fatigue  HEENT: negative for vision changes, hearing loss, congestion, rhinorrhea, ST, epistaxis, or sinus pressure  Cardiovascular: negative for chest pain or palpitations  Respiratory: negative for hemoptysis, wheezing, shortness of breath, or cough  Neurologic: negative for headache, dizziness, or syncope   Physical Exam:  General: Well developed, well nourished, in no acute distress.  Head: Normocephalic, atraumatic, eyes without discharge, sclera non-icteric. Oral cavity moist, posterior pharynx without exudate, erythema, peritonsillar abscess, or post nasal drip.  Neck: Supple. No thyromegaly. Full ROM. No lymphadenopathy.  Lungs: Clear bilaterally to auscultation without wheezes, rales, or rhonchi. Breathing is unlabored.  Heart: RRR with S1 S2. No murmurs, rubs, or gallops appreciated.  Abd: soft, NT, ND, NABS, no HSM/masses Msk: Strength and tone normal for age.  Extremities/Skin: Warm and dry. No clubbing or cyanosis. No edema.  Neuro: Alert and oriented X 3. Moves all extremities spontaneously. Gait is normal. CNII-XII grossly in tact.  Psych: Responds to questions appropriately with a normal affect.     ASSESSMENT AND PLAN:   1. Colorectal cancer screening. Colonoscopy scheduled. The risks, benefits, and alternatives to colonoscopy with possible biopsy and possible polypectomy were discussed with the patient and they consent to proceed.

## 2012-08-25 NOTE — Op Note (Signed)
[facility] ,   COLONOSCOPY PROCEDURE REPORT  PATIENT: April [name], April [name]  MR#: [id number] BIRTHDATE: [birth year] , 61  yrs. old GENDER: Female ENDOSCOPIST: Meryl Dare, MD, [facility] REFERRED [id number] Ananias Pilgrim, M.D. PROCEDURE DATE:  08/25/2012 PROCEDURE:   Colonoscopy with snare polypectomy ASA CLASS:   Class III INDICATIONS:average risk screening. MEDICATIONS: medications were titrated to patient response per physician's verbal order, Fentanyl 50 mcg IV, and Versed 5 mg IV DESCRIPTION OF PROCEDURE:   After the risks benefits and alternatives of the procedure were thoroughly explained, informed consent was obtained.  A digital rectal exam revealed no abnormalities of the rectum.   The     endoscope was introduced through the anus and advanced to the cecum, which was identified by both the appendix and ileocecal valve. No adverse events experienced.   The quality of the prep was excellent, using MoviPrep  The instrument was then slowly withdrawn as the colon was fully examined.  COLON FINDINGS: A sessile polyp measuring 7 mm in size was found at the hepatic flexure.  A polypectomy was performed with a cold snare.  The resection was complete and the polyp tissue was completely retrieved.   Moderate diverticulosis was noted in the sigmoid colon and descending colon.   The colon was otherwise normal.  There was no diverticulosis, inflammation, polyps or cancers unless previously stated.  Retroflexed views revealed no abnormalities. The time to cecum=minutes 0 seconds.  Withdrawal time=11 minutes 0 seconds.  The scope was withdrawn and the procedure completed. COMPLICATIONS: There were no complications.  ENDOSCOPIC IMPRESSION: 1.   Sessile polyp measuring 7 mm at the hepatic flexure; polypectomy performed with a cold snare 2.   Moderate diverticulosis was noted in the sigmoid colon and descending colon  RECOMMENDATIONS: 1.  Await pathology results 2.  Repeat colonoscopy  in 5 years if polyp adenomatous; otherwise 10 years 3.  High fiber diet with liberal fluid intake.  eSigned:  Meryl Dare, MD, [facility] 08/25/2012 11:14 AM

## 2012-08-26 ENCOUNTER — Encounter (HOSPITAL_COMMUNITY): Payer: Self-pay | Admitting: Gastroenterology

## 2012-08-26 ENCOUNTER — Encounter: Payer: Self-pay | Admitting: Gastroenterology

## 2012-09-11 ENCOUNTER — Other Ambulatory Visit: Payer: BC Managed Care – PPO

## 2012-09-11 ENCOUNTER — Encounter: Payer: BC Managed Care – PPO | Admitting: Cardiothoracic Surgery

## 2012-09-25 ENCOUNTER — Other Ambulatory Visit: Payer: BC Managed Care – PPO

## 2012-09-25 ENCOUNTER — Encounter: Payer: BC Managed Care – PPO | Admitting: Cardiothoracic Surgery

## 2012-10-09 ENCOUNTER — Encounter: Payer: Self-pay | Admitting: Cardiothoracic Surgery

## 2012-10-09 ENCOUNTER — Ambulatory Visit
Admission: RE | Admit: 2012-10-09 | Discharge: 2012-10-09 | Disposition: A | Discharge status: Home / Self Care | Payer: BC Managed Care – PPO | Source: Ambulatory Visit | Attending: Cardiothoracic Surgery | Admitting: Cardiothoracic Surgery

## 2012-10-09 ENCOUNTER — Ambulatory Visit (INDEPENDENT_AMBULATORY_CARE_PROVIDER_SITE_OTHER): Payer: BC Managed Care – PPO | Admitting: Cardiothoracic Surgery

## 2012-10-09 VITALS — BP 125/79 | HR 64 | Resp 16 | Ht 64.0 in | Wt 190.0 lb

## 2012-10-09 DIAGNOSIS — D150 Benign neoplasm of thymus: Secondary | ICD-10-CM

## 2012-10-09 DIAGNOSIS — Z09 Encounter for follow-up examination after completed treatment for conditions other than malignant neoplasm: Secondary | ICD-10-CM

## 2012-10-09 DIAGNOSIS — R222 Localized swelling, mass and lump, trunk: Secondary | ICD-10-CM

## 2012-10-09 DIAGNOSIS — J9859 Other diseases of mediastinum, not elsewhere classified: Secondary | ICD-10-CM

## 2012-10-10 NOTE — Progress Notes (Signed)
[address]            [place] [address]          [phone]       April [name] [facility] Medical Record #[id number] Date of Birth: 1950-11-18  Wendall Stade, MD [facility], [place]  Chief Complaint:   PostOp Follow Up Visit 10/31/2011   PREOPERATIVE DIAGNOSIS: Anterior mediastinal mass consistent with  thymoma.  POSTOPERATIVE DIAGNOSIS: Anterior mediastinal mass consistent with  thymoma.  SURGICAL PROCEDURE: Median sternotomy with resection of anterior  mediastinal tumor, probable well encapsulated thymoma.   History of Present Illness:      Patient's doing well now almost a year following resection of an encapsulated benign-appearing. She denies any chest discomfort has returned to full activities.     History  Smoking status  . Former Smoker -- 1.0 packs/day for 17 years  . Types: Cigarettes  . Quit date: 09/26/1983  Smokeless tobacco  . Never Used       No Known Allergies  Current Outpatient Prescriptions  Medication Sig Dispense Refill  . Ascorbic Acid (VITAMIN C) 1000 MG tablet Take 1,000 mg by mouth daily.        Marland Kitchen BETAINE PO Take 3 tablets by mouth 3 (three) times daily with meals.       . Cholecalciferol (VITAMIN D3) 5000 UNITS CAPS Take 1 tablet by mouth daily.       . Coenzyme Q10 (COQ10 PO) Take 1 capsule by mouth daily.      . Iodine Strong, Lugols, (IODINE STRONG PO) Take 2 capsules by mouth 2 (two) times daily.      Marland Kitchen liothyronine (CYTOMEL) 5 MCG tablet 2 tablets every morning and 1 tablet in the afternoon.      . Magnesium (MAGNESIUM GLYCINATE PLUS) 110 MG CAPS Take 2 capsules by mouth 2 (two) times daily.      Marland Kitchen MILK THISTLE PO Take 1 capsule by mouth daily.       Marland Kitchen MOVIPREP 100 G SOLR moviprep-take as directed.  1 kit  0  . NON FORMULARY Take 2 capsules by mouth 3 (three) times daily with meals. Ultra nutrient supplement       . NON FORMULARY Take 1 capsule by mouth every evening. Annatto  Tocotrienols Supplement        . NON FORMULARY Take 1 tablet by mouth 3 (three) times daily with meals. Primary Digest Supplement.       Marland Kitchen OVER THE COUNTER MEDICATION Take 1 tablet by mouth 2 (two) times daily. hemagenics iron supplement.      . pravastatin (PRAVACHOL) 40 MG tablet Take 40 mg by mouth every evening.       . thyroid (ARMOUR) 60 MG tablet Take 90 mg by mouth daily before breakfast.       . vitamin A 46962 UNIT capsule Take 25,000 Units by mouth daily.            Physical Exam: BP 125/79  Pulse 64  Resp 16  Ht 5\' 4"  (1.626 m)  Wt 190 lb (86.183 kg)  BMI 32.61 kg/m2  SpO2 98%  General appearance: alert and cooperative Neurologic: intact Heart: regular rate and rhythm, S1, S2 normal, no murmur, click, rub or gallop and normal apical impulse Lungs: clear to auscultation bilaterally and normal percussion bilaterally Abdomen: soft, non-tender; bowel sounds normal; no masses,  no  organomegaly Extremities: extremities normal, atraumatic, no cyanosis or edema and Homans sign is negative, no sign of DVT Wound: Sternum is stable and well healed   Diagnostic Studies & Laboratory data:         Recent Radiology Findings: Ct Chest Wo Contrast  10/09/2012  *RADIOLOGY REPORT*  Clinical Data: History of thymoma resection in February 2013. Follow-up study.  CT CHEST WITHOUT CONTRAST  Technique:  Multidetector CT imaging of the chest was performed following the standard protocol without IV contrast.  Comparison: Chest CT 08/31/2011.  Findings:  Mediastinum: Surgical clips are present in the anterior mediastinum.  Previously noted anterior mediastinal mass is no longer identified. Heart size is borderline enlarged. There is no significant pericardial fluid, thickening or pericardial calcification. No pathologically enlarged mediastinal or hilar lymph nodes. Please note that accurate exclusion of hilar adenopathy is limited on noncontrast CT scans.  Esophagus is unremarkable in  appearance.  Lungs/Pleura: 4 mm nodule along the left major fissure (image 28 of series 4) is unchanged compared to the prior study from 08/31/2011, and is favored to be a benign subpleural lymph node.  No other larger more suspicious appearing pulmonary nodules or masses are otherwise noted.  No acute consolidative airspace disease.  No pleural effusions.  Upper Abdomen: Status post cholecystectomy.  Splenomegaly (14.7 cm AP) is similar to the prior study.  Musculoskeletal: Sternotomy wires. There are no aggressive appearing lytic or blastic lesions noted in the visualized portions of the skeleton.  IMPRESSION: 1.  Status post median sternotomy for resection of a thymoma without evidence to suggest local recurrence of disease. 2.  Borderline cardiomegaly. 3.  Splenomegaly. 4.  Status post cholecystectomy.   Original Report Authenticated By: Trudie Reed, M.D.       Recent Labs: Lab Results  Component Value Date   WBC 8.8 11/02/2011   HGB 12.3 11/02/2011   HCT 36.5 11/02/2011   PLT 189 11/02/2011   GLUCOSE 107* 11/02/2011   ALT 23 10/29/2011   AST 25 10/29/2011   NA 136 11/02/2011   K 3.8 11/02/2011   CL 102 11/02/2011   CREATININE 0.58 11/02/2011   BUN 10 11/02/2011   CO2 27 11/02/2011   INR 1.02 10/29/2011      Assessment / Plan:     Good progress following excision of large anterior mediastinal mass- which was a benign thymoma No evidence of recurrence on CT scan. We'll plan the see her in followup in one year with a PA and lateral chest x-ray.         Zeah Germano B 10/10/2012 4:48 PM

## 2012-10-25 ENCOUNTER — Other Ambulatory Visit: Payer: Self-pay

## 2013-02-20 IMAGING — CR DG LUMBAR SPINE COMPLETE 4+V
5 series · 5 of 5 positions shown · non-contrast
Comparison: None.

CLINICAL DATA: Abnormal neurological exam of the lower extremities

LUMBAR SPINE - COMPLETE 4+ VIEW

[view not recorded (1 of 5)]
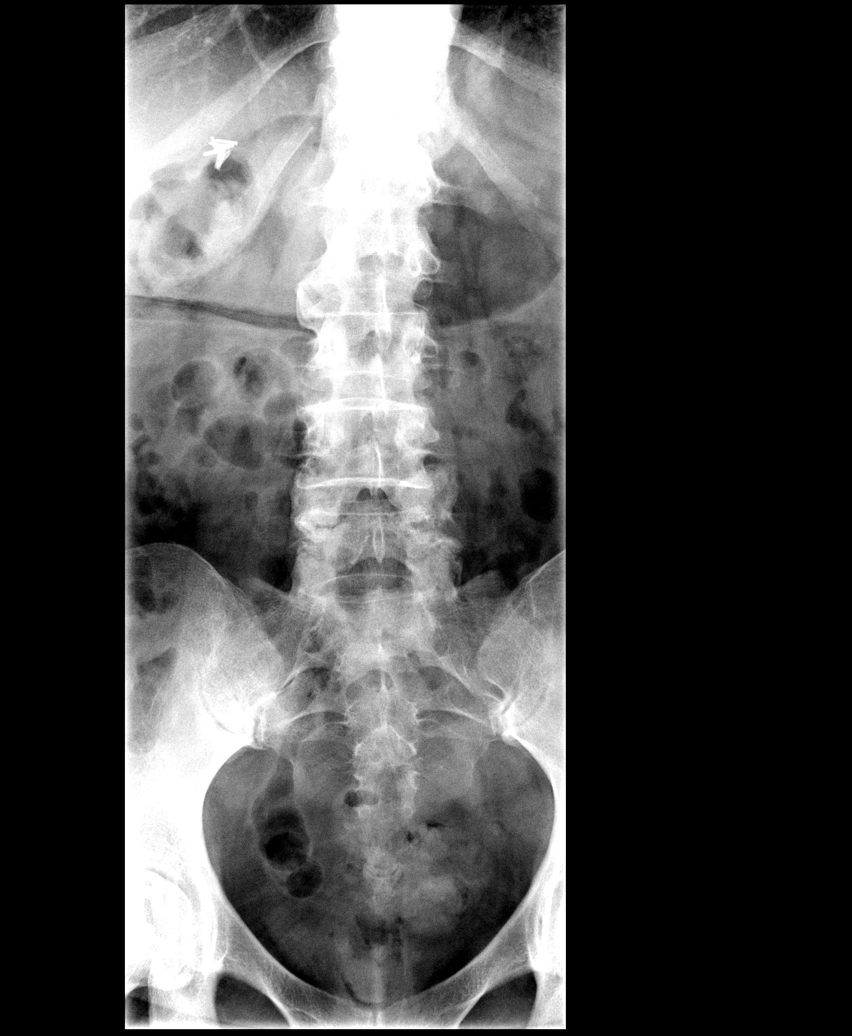

[view not recorded (2 of 5)]
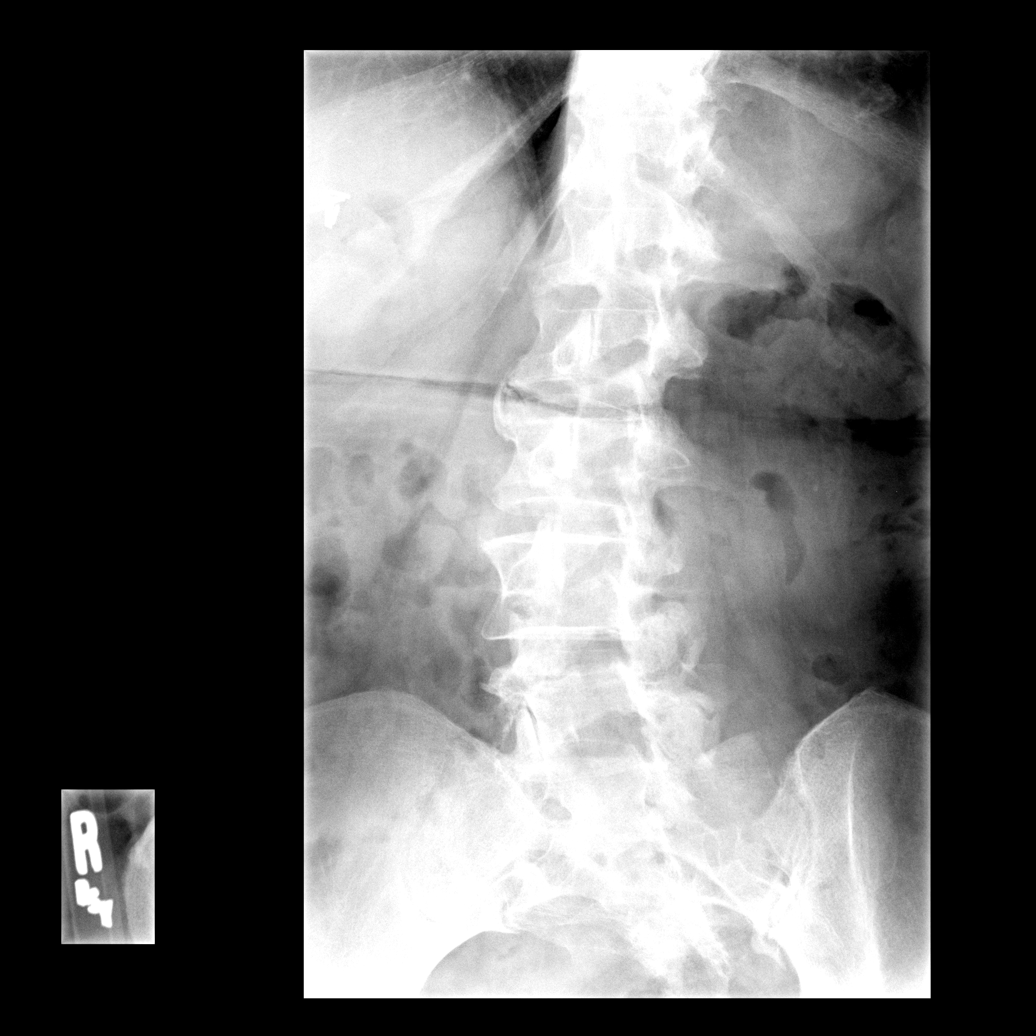

[view not recorded (3 of 5)]
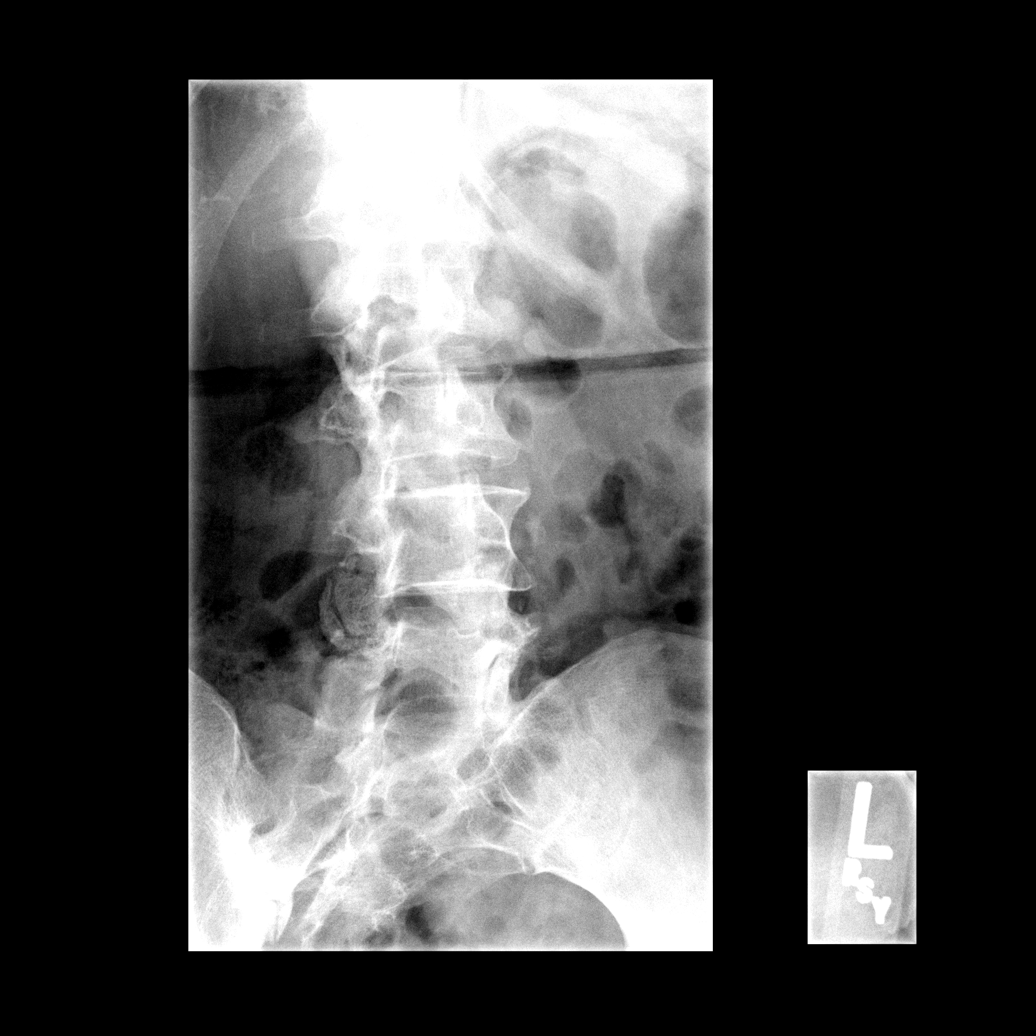

[view not recorded (4 of 5)]
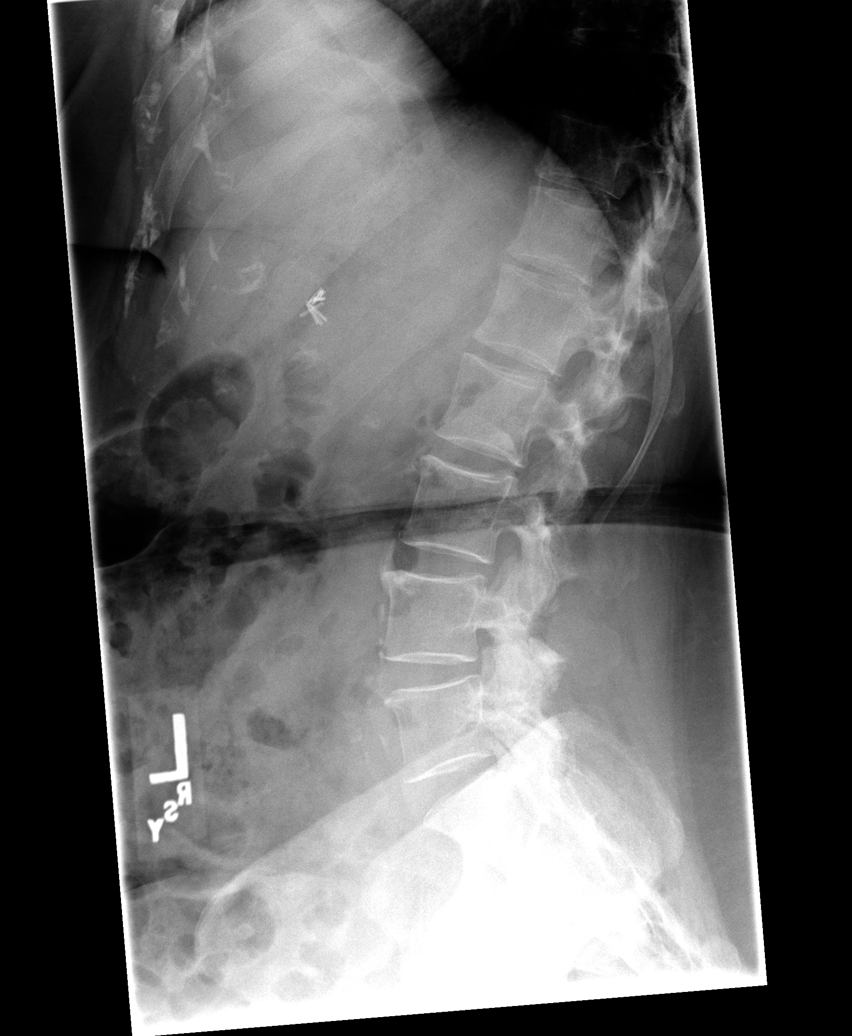

[view not recorded (5 of 5)]
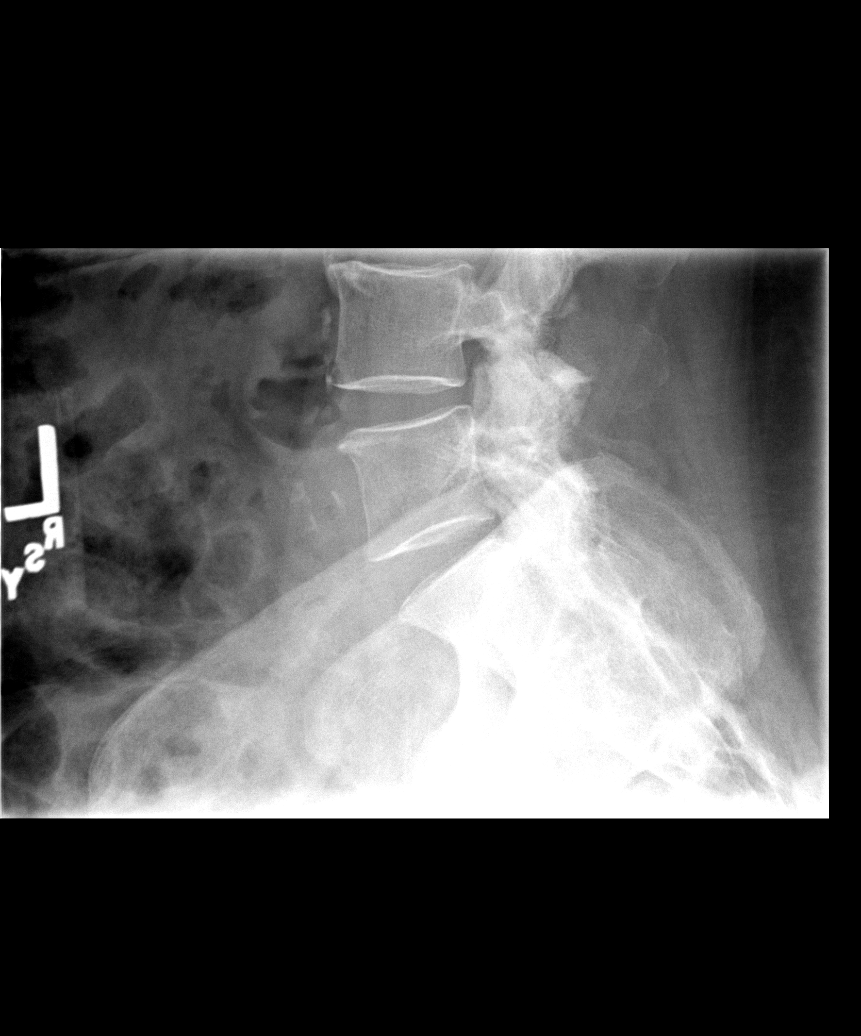

[5 of 5 positions shown; findings below may reference images not displayed]

FINDINGS: The lumbar vertebrae are in normal alignment.
Intervertebral disc spaces appear normal.  There are degenerative
changes involving the facet joints particularly at L4-5 and L5-S1.
The SI joints appear normal.  Surgical clips are present in the
right upper quadrant from prior cholecystectomy.
IMPRESSION: 1.  Normal disc spaces and normal alignment.
2.  Degenerative change of the facet joints of L4-5 and L5-S1.

## 2013-07-16 ENCOUNTER — Other Ambulatory Visit: Payer: Self-pay

## 2013-10-06 ENCOUNTER — Other Ambulatory Visit: Payer: Self-pay | Admitting: *Deleted

## 2013-10-06 DIAGNOSIS — D150 Benign neoplasm of thymus: Secondary | ICD-10-CM

## 2013-10-08 ENCOUNTER — Ambulatory Visit (INDEPENDENT_AMBULATORY_CARE_PROVIDER_SITE_OTHER): Payer: BC Managed Care – PPO | Admitting: Cardiothoracic Surgery

## 2013-10-08 ENCOUNTER — Ambulatory Visit
Admission: RE | Admit: 2013-10-08 | Discharge: 2013-10-08 | Disposition: A | Discharge status: Home / Self Care | Payer: BC Managed Care – PPO | Source: Ambulatory Visit | Attending: Cardiothoracic Surgery | Admitting: Cardiothoracic Surgery

## 2013-10-08 ENCOUNTER — Encounter: Payer: Self-pay | Admitting: Cardiothoracic Surgery

## 2013-10-08 VITALS — BP 116/68 | HR 64 | Resp 16 | Ht 64.0 in | Wt 200.0 lb

## 2013-10-08 DIAGNOSIS — J9859 Other diseases of mediastinum, not elsewhere classified: Secondary | ICD-10-CM

## 2013-10-08 DIAGNOSIS — D150 Benign neoplasm of thymus: Secondary | ICD-10-CM

## 2013-10-08 DIAGNOSIS — Z09 Encounter for follow-up examination after completed treatment for conditions other than malignant neoplasm: Secondary | ICD-10-CM

## 2013-10-08 DIAGNOSIS — R222 Localized swelling, mass and lump, trunk: Secondary | ICD-10-CM

## 2013-10-08 NOTE — Progress Notes (Signed)
[place] Record #[id number] Date of Birth: [birth year]  April Hector, MD Lollie Sails, MD  Chief Complaint:   PostOp Follow Up Visit 10/31/2011  PREOPERATIVE DIAGNOSIS: Anterior mediastinal mass consistent with  thymoma.  POSTOPERATIVE DIAGNOSIS: Anterior mediastinal mass consistent with  thymoma.  SURGICAL PROCEDURE: Median sternotomy with resection of anterior  mediastinal tumor, probable well encapsulated thymoma.   History of Present Illness:      Patient's doing well now 2 years  following resection of an encapsulated benign-appearing thyoma. She denies any chest discomfort has returned to full activities.   History  Smoking status  . Former Smoker -- 1.00 packs/day for 17 years  . Types: Cigarettes  . Quit date: 09/26/1983  Smokeless tobacco  . Never Used       No Known Allergies  Current Outpatient Prescriptions  Medication Sig Dispense Refill  . Ascorbic Acid (VITAMIN C) 1000 MG tablet Take 1,000 mg by mouth daily.        Marland Kitchen BETAINE PO Take 3 tablets by mouth 3 (three) times daily with meals.       . Cholecalciferol (VITAMIN D3) 5000 UNITS CAPS Take 1 tablet by mouth daily.       . Coenzyme Q10 (COQ10 PO) Take 1 capsule by mouth daily.      . Iodine Strong, Lugols, (IODINE STRONG PO) Take 2 capsules by mouth 2 (two) times daily.      Marland Kitchen liothyronine (CYTOMEL) 5 MCG tablet 2 tablets every morning and 1 tablet in the afternoon.      . Magnesium (MAGNESIUM GLYCINATE PLUS) 110 MG CAPS Take 2 capsules by mouth 2 (two) times daily.      . NON FORMULARY Take 2 capsules by mouth 3 (three) times daily with meals. Ultra nutrient supplement       . NON FORMULARY Take 1 capsule by mouth every evening. Annatto Tocotrienols Supplement        . NON FORMULARY Take 1 tablet by mouth 3 (three) times daily with meals. Primary Digest Supplement.       Marland Kitchen OVER THE COUNTER MEDICATION Take 1 tablet by mouth 2 (two) times daily. hemagenics iron  supplement.      . pravastatin (PRAVACHOL) 40 MG tablet Take 40 mg by mouth every evening.       . thyroid (ARMOUR) 60 MG tablet Take 90 mg by mouth daily before breakfast.       . vitamin A 25000 UNIT capsule Take 25,000 Units by mouth daily.        No current facility-administered medications for this visit.       Physical Exam: BP 116/68  Pulse 64  Resp 16  Ht 5\' 4"  (1.626 m)  Wt 200 lb (90.719 kg)  BMI 34.31 kg/m2  SpO2 99%  General appearance: alert and cooperative Neurologic: intact Heart: regular rate and rhythm, S1, S2 normal, no murmur, click, rub or gallop and normal apical impulse Lungs: clear to auscultation bilaterally and normal percussion bilaterally Abdomen: soft, non-tender; bowel sounds normal; no masses,  no organomegaly Extremities: extremities normal, atraumatic, no cyanosis or edema and Homans sign is negative, no sign of DVT Wound: Sternum is stable and well healed   Diagnostic Studies & Laboratory data:         Recent Radiology Findings: Dg Chest 2 View  10/08/2013   CLINICAL DATA:  History of thymic resection for neoplasm  EXAM: CHEST  2 VIEW  COMPARISON:  CT CHEST W/O CM dated 10/09/2012; DG CHEST 2 VIEW dated 11/19/2011  FINDINGS: The lungs are adequately inflated. There is no focal infiltrate. There is no pleural effusion. There is stable density projects over the anterior aspect of the left first rib. The cardiac silhouette is normal in size. Seven intact sternal wires are demonstrated. The retrosternal soft tissues do not appear abnormal.  IMPRESSION: There are no findings suspicious for recurrent thymic tissue nor other acute cardiopulmonary disease. Follow-up chest CT scanning would be useful if there are clinical concerns of recurrent neoplasm.   Electronically Signed   By: David  Martinique   On: 10/08/2013 10:28      Recent Labs: Lab Results  Component Value Date   WBC 8.8 11/02/2011   HGB 12.3 11/02/2011   HCT 36.5 11/02/2011   PLT 189 11/02/2011     GLUCOSE 107* 11/02/2011   ALT 23 10/29/2011   AST 25 10/29/2011   NA 136 11/02/2011   K 3.8 11/02/2011   CL 102 11/02/2011   CREATININE 0.58 11/02/2011   BUN 10 11/02/2011   CO2 27 11/02/2011   INR 1.02 10/29/2011   Patient does not get flu shots and has not had one this year, she has had colonoscopy, she will talk to her primary care doctor next year about pneumococcal vaccination   Assessment / Plan:   Good progress following excision of large anterior mediastinal mass- which was a benign thymoma No evidence of recurrence on CT scan last year. Patient is concerned about radiation exposure and did not wish to have a followup CT scan. Chest x-ray done today is clear We'll plan the see her in followup in one year with a PA and lateral chest x-ray.   Naquan Garman B 10/08/2013 12:00 PM

## 2013-11-12 ENCOUNTER — Ambulatory Visit (INDEPENDENT_AMBULATORY_CARE_PROVIDER_SITE_OTHER): Payer: BC Managed Care – PPO | Admitting: Family Medicine

## 2013-11-12 ENCOUNTER — Telehealth: Payer: Self-pay

## 2013-11-12 VITALS — BP 116/80 | HR 96 | Temp 101.5°F | Resp 16 | Ht 63.0 in | Wt 198.2 lb

## 2013-11-12 DIAGNOSIS — R509 Fever, unspecified: Secondary | ICD-10-CM

## 2013-11-12 DIAGNOSIS — R05 Cough: Secondary | ICD-10-CM

## 2013-11-12 DIAGNOSIS — J101 Influenza due to other identified influenza virus with other respiratory manifestations: Secondary | ICD-10-CM

## 2013-11-12 DIAGNOSIS — J111 Influenza due to unidentified influenza virus with other respiratory manifestations: Secondary | ICD-10-CM

## 2013-11-12 DIAGNOSIS — R059 Cough, unspecified: Secondary | ICD-10-CM

## 2013-11-12 LAB — POCT CBC
GRANULOCYTE PERCENT: 82.7 % — AB (ref 37–80)
HEMATOCRIT: 42.3 % (ref 37.7–47.9)
HEMOGLOBIN: 13.6 g/dL (ref 12.2–16.2)
Lymph, poc: 1.2 (ref 0.6–3.4)
MCH, POC: 28.1 pg (ref 27–31.2)
MCHC: 32.2 g/dL (ref 31.8–35.4)
MCV: 87.4 fL (ref 80–97)
MID (cbc): 0.8 (ref 0–0.9)
MPV: 7.9 fL (ref 0–99.8)
POC GRANULOCYTE: 9.3 — AB (ref 2–6.9)
POC LYMPH %: 10.6 % (ref 10–50)
POC MID %: 6.7 %M (ref 0–12)
Platelet Count, POC: 245 10*3/uL (ref 142–424)
RBC: 4.84 M/uL (ref 4.04–5.48)
RDW, POC: 14.1 %
WBC: 11.2 10*3/uL — AB (ref 4.6–10.2)

## 2013-11-12 LAB — POCT INFLUENZA A/B
INFLUENZA A, POC: NEGATIVE
INFLUENZA B, POC: POSITIVE

## 2013-11-12 MED ORDER — BENZONATATE 200 MG PO CAPS: 200.0000 mg | ORAL_CAPSULE | Freq: Three times a day (TID) | ORAL | Status: DC | PRN

## 2013-11-12 MED ORDER — IPRATROPIUM BROMIDE 0.03 % NA SOLN: NASAL | Status: DC

## 2013-11-12 MED ORDER — BENZONATATE 100 MG PO CAPS: 100.0000 mg | ORAL_CAPSULE | Freq: Three times a day (TID) | ORAL | Status: DC | PRN

## 2013-11-12 NOTE — Patient Instructions (Signed)

## 2013-11-12 NOTE — Progress Notes (Signed)
Subjective: 63 year old lady who is here with a history of having been ill since she got up on Tuesday. She has had nasal congestion and a dry cough. She has not been able to use her CPAP because of the congestion. She doesn't feel well. Not much in the way of achiness. She has had a slight headache. She did have some nausea but no vomiting. Loss of appetite. She did not have a flu shot this year. She does have a history of having had a thymectomy in the past. Still continues to be followed up yearly for that. She does not smoke.  Objective: TMs are normal. Throat clear. Neck supple without significant nodes. Chest is clear to auscultation. Apparently she had some wheezing at home at times but I don't hear any. Heart regular without murmurs. She has a midline thoracotomy scar.  Assessment: Cough, congestion, and fever, consistent with a possible flulike illness   Plan: Tylenol 1000 mg by mouth CBC and influenza test  Results for orders placed in visit on 11/12/13  POCT CBC      Result Value Ref Range   WBC 11.2 (*) 4.6 - 10.2 K/uL   Lymph, poc 1.2  0.6 - 3.4   POC LYMPH PERCENT 10.6  10 - 50 %L   MID (cbc) 0.8  0 - 0.9   POC MID % 6.7  0 - 12 %M   POC Granulocyte 9.3 (*) 2 - 6.9   Granulocyte percent 82.7 (*) 37 - 80 %G   RBC 4.84  4.04 - 5.48 M/uL   Hemoglobin 13.6  12.2 - 16.2 g/dL   HCT, POC 42.3  37.7 - 47.9 %   MCV 87.4  80 - 97 fL   MCH, POC 28.1  27 - 31.2 pg   MCHC 32.2  31.8 - 35.4 g/dL   RDW, POC 14.1     Platelet Count, POC 245  142 - 424 K/uL   MPV 7.9  0 - 99.8 fL  POCT INFLUENZA A/B      Result Value Ref Range   Influenza A, POC Negative     Influenza B, POC Positive

## 2013-11-12 NOTE — Telephone Encounter (Signed)
Pharm called to ask for change on both Rxs sent. Nasal spray had been sent w/directions to take "tablets" and I approved the change to sprays. Also Tessalon 100 mgs is on backorder. Approved change to 200 mg capsules, to take 1 caps TID w/adjusted #. Both were approved by Dr Linna Darner.

## 2013-11-13 ENCOUNTER — Telehealth: Payer: Self-pay | Admitting: Family Medicine

## 2013-11-13 MED ORDER — ALBUTEROL SULFATE HFA 108 (90 BASE) MCG/ACT IN AERS: 2.0000 | INHALATION_SPRAY | RESPIRATORY_TRACT | Status: DC | PRN

## 2013-11-13 NOTE — Telephone Encounter (Signed)
Pt.notified

## 2013-11-13 NOTE — Telephone Encounter (Signed)
Patients partner, Blair Promise called  Maryln Eastham did not receive the inhaler Dr Linna Darner had discussed with her. Her sleep was disturbed last night due to her illness. After looking in dr hoppers notes i didn't see where he was going to prescribe an inhaler, and let the patient know this. I stated i would send this message to DR Linna Darner for review and she would receive a call back from [facility] within 24-48 hours. She stated this was totally unacceptable and wanted a someone else to handle this. I stated we would respond as soon as possible, but it may take 24-48 hours. She stated she would expect a call very soon, and wants [place] to call [facility] at [phone].

## 2013-11-13 NOTE — Telephone Encounter (Signed)
Please notify patient Meds ordered this encounter  Medications  . albuterol (PROVENTIL HFA;VENTOLIN HFA) 108 (90 BASE) MCG/ACT inhaler    Sig: Inhale 2 puffs into the lungs every 4 (four) hours as needed for wheezing or shortness of breath (cough, shortness of breath or wheezing.).    Dispense:  1 Inhaler    Refill:  1    Order Specific Question:  Supervising Provider    Answer:  DOOLITTLE, ROBERT P [[id number]]

## 2013-11-19 ENCOUNTER — Ambulatory Visit (INDEPENDENT_AMBULATORY_CARE_PROVIDER_SITE_OTHER): Payer: BC Managed Care – PPO | Admitting: Family Medicine

## 2013-11-19 ENCOUNTER — Ambulatory Visit: Payer: BC Managed Care – PPO

## 2013-11-19 ENCOUNTER — Inpatient Hospital Stay
Admission: RE | Admit: 2013-11-19 | Discharge: 2013-11-19 | Disposition: A | Discharge status: Home / Self Care | Payer: Self-pay | Source: Ambulatory Visit | Attending: Family Medicine | Admitting: Family Medicine

## 2013-11-19 VITALS — BP 120/72 | HR 81 | Temp 98.6°F | Resp 16 | Ht 64.0 in | Wt 198.0 lb

## 2013-11-19 DIAGNOSIS — R509 Fever, unspecified: Secondary | ICD-10-CM

## 2013-11-19 DIAGNOSIS — R059 Cough, unspecified: Secondary | ICD-10-CM

## 2013-11-19 DIAGNOSIS — J189 Pneumonia, unspecified organism: Secondary | ICD-10-CM

## 2013-11-19 DIAGNOSIS — R05 Cough: Secondary | ICD-10-CM

## 2013-11-19 MED ORDER — CEFTRIAXONE SODIUM 1 G IJ SOLR
1.0000 g | Freq: Once | INTRAMUSCULAR | Status: AC
  Administered 2013-11-19: 1 g via INTRAMUSCULAR

## 2013-11-19 MED ORDER — HYDROCODONE-HOMATROPINE 5-1.5 MG/5ML PO SYRP: 5.0000 mL | ORAL_SOLUTION | Freq: Three times a day (TID) | ORAL | Status: DC | PRN

## 2013-11-19 MED ORDER — CEFDINIR 300 MG PO CAPS: 300.0000 mg | ORAL_CAPSULE | Freq: Two times a day (BID) | ORAL | Status: DC

## 2013-11-19 NOTE — Progress Notes (Signed)
[facility] [address], [place] [address] [phone]  Date:  11/19/2013   Name:  April [name]   DOB:  [birth year]   MRN:  [id number]  PCP:  Lollie Sails, MD    Chief Complaint: Follow-up   History of Present Illness:  April [name] is a 63 y.o. very pleasant female patient who presents with the following:  Here today to follow-up illness. She was here on 3/5 with illness- she was dx with flu B.  She still has "a horrible cough" and has been running a fever up to 101 or so. She has continued to have a fever over the last few days.  She is coughing up some mucus.   She continues to feel fatigued.   She is coughing a lot at night- this keeps her awake.  She has to prop up to sleep at night some of the time  She did take ibuprofen at 1100 today.    Patient Active Problem List   Diagnosis Date Noted  . Benign neoplasm of colon 08/25/2012  . Special screening for malignant neoplasms, colon 08/25/2012  . Mediastinal mass 08/24/2011  . URI (upper respiratory infection) 08/24/2011  . Hypothyroid 08/24/2011    Past Medical History  Diagnosis Date  . Obesity   . Hypothyroidism   . Insomnia   . Mineral deficiency   . Hypochlorhydria   . Hypercholesteremia   . Cataract   . Vitamin D deficiency   . Fatigue   . Candidiasis of intestine   . Vitamin D deficiency   . Radiculopathy, lumbar region   . Bronchitis   . Sleep apnea     cpap=?? SETTING  . Recurrent upper respiratory infection (URI)     COLD IN JAN....STILL HAS OCCAS. COUGHING  . Anemia   . Complication of anesthesia     pt states they said it took 3 attempts to get tube down-small esophagus    Past Surgical History  Procedure Laterality Date  . Laparoscopic cholecystectomy    . Gallbladder surgery    . Tonsillectomy    . Lung biopsy    . Mediasternotomy  10/31/2011    Procedure: MEDIASTERNOTOMY;  Surgeon: Grace Isaac, MD;  Location: [facility];  Service: Thoracic;  Laterality: N/A;   RESECTION OF THYMOMA, FULL STERNOTOMY  . Colonoscopy  08/25/2012    Procedure: COLONOSCOPY;  Surgeon: Ladene Artist, MD,FACG;  Location: [facility];  Service: Endoscopy;  Laterality: N/A;  . Stenotomy       STENOTOMY/THYMECTOMY    History  Substance Use Topics  . Smoking status: Former Smoker -- 1.00 packs/day for 17 years    Types: Cigarettes    Quit date: 09/26/1983  . Smokeless tobacco: Never Used  . Alcohol Use: No    Family History  Problem Relation Age of Onset  . Colon cancer Neg Hx     No Known Allergies  Medication list has been reviewed and updated.  Current Outpatient Prescriptions on File Prior to Visit  Medication Sig Dispense Refill  . albuterol (PROVENTIL HFA;VENTOLIN HFA) 108 (90 BASE) MCG/ACT inhaler Inhale 2 puffs into the lungs every 4 (four) hours as needed for wheezing or shortness of breath (cough, shortness of breath or wheezing.).  1 Inhaler  1  . Ascorbic Acid (VITAMIN C) 1000 MG tablet Take 1,000 mg by mouth daily.        Marland Kitchen BETAINE PO Take 3 tablets by mouth 3 (three) times daily with meals.       Marland Kitchen  Cholecalciferol (VITAMIN D3) 5000 UNITS CAPS Take 1 tablet by mouth daily.       . Coenzyme Q10 (COQ10 PO) Take 1 capsule by mouth daily.      . Iodine Strong, Lugols, (IODINE STRONG PO) Take 2 capsules by mouth 2 (two) times daily.      Marland Kitchen liothyronine (CYTOMEL) 5 MCG tablet 2 tablets every morning and 1 tablet in the afternoon.      . Magnesium (MAGNESIUM GLYCINATE PLUS) 110 MG CAPS Take 2 capsules by mouth 2 (two) times daily.      . NON FORMULARY Take 2 capsules by mouth 3 (three) times daily with meals. Ultra nutrient supplement       . pravastatin (PRAVACHOL) 40 MG tablet Take 40 mg by mouth every evening.       . thyroid (ARMOUR) 60 MG tablet Take 90 mg by mouth daily before breakfast.       . vitamin A 25000 UNIT capsule Take 25,000 Units by mouth daily.       . benzonatate (TESSALON) 200 MG capsule Take 1 capsule (200 mg total) by mouth 3  (three) times daily as needed for cough.  13 capsule  0  . ipratropium (ATROVENT) 0.03 % nasal spray Use 1 or 2 sprays in each nostril every 6 or 8 hours as needed  30 mL  0   No current facility-administered medications on file prior to visit.    Review of Systems:  As per HPI- otherwise negative.   Physical Examination: Filed Vitals:   11/19/13 1221  BP: 120/72  Pulse: 81  Temp: 98.6 F (37 C)  Resp: 16   Filed Vitals:   11/19/13 1221  Height: 5\' 4"  (1.626 m)  Weight: 198 lb (89.812 kg)   Body mass index is 33.97 kg/(m^2). Ideal Body Weight: Weight in (lb) to have BMI = 25: 145.3  GEN: WDWN, NAD, Non-toxic, A & O x 3, overweight, appears well HEENT: Atraumatic, Normocephalic. Neck supple. No masses, No LAD.  Bilateral TM wnl, oropharynx normal.  PEERL,EOMI.   Ears and Nose: No external deformity. CV: RRR, No M/G/R. No JVD. No thrill. No extra heart sounds. PULM: moving air bilaterally with some wheezes and congestion.. No retractions. No resp. distress. No accessory muscle use. ABD: S, NT, ND. EXTR: No c/c/e NEURO Normal gait.  PSYCH: Normally interactive. Conversant. Not depressed or anxious appearing.  Calm demeanor.   [facility] reading (PRIMARY) by  Dr. Lorelei Pont. CXR: retrocardiac infiltrate  EXAM: CHEST 2 VIEW  COMPARISON: None.  FINDINGS: Lungs are adequately inflated with hazy opacification over the posterior left lower lobe likely a pneumonia. No evidence of effusion. Cardiomediastinal silhouette is within normal. There is mild calcified plaque over the thoracic aorta. Sternotomy wires are present. There is degenerative change of the spine.  IMPRESSION: Hazy airspace process over the posterior left lower lobe likely a pneumonia.  Assessment and Plan: Cough - Plan: DG Chest 2 View, DG Chest 2 View, HYDROcodone-homatropine (HYCODAN) 5-1.5 MG/5ML syrup  Fever, unspecified - Plan: DG Chest 2 View, DG Chest 2 View  CAP (community acquired pneumonia) - Plan:  cefTRIAXone (ROCEPHIN) injection 1 g, cefdinir (OMNICEF) 300 MG capsule  Pneumonia following influenza.  Treat today with rocephin IM and omnicef,  She has albuterol at home. Hycodan as needed for cough.  She will let us know if not better soon- Sooner if worse.     Signed Lamar Blinks, MD

## 2013-11-19 NOTE — Patient Instructions (Signed)
We are going to treat you for pneumonia- you received a shot of rocephin today. This evening start taking the omnicef antibiotic rx twice a day for 10 days.  You can use the cough syrup as needed.  Remember it will make you feel sleepy- do not use when you need to drive.   Drink plenty of fluids and let me know if you are not feeling better soon!

## 2014-01-29 ENCOUNTER — Ambulatory Visit (INDEPENDENT_AMBULATORY_CARE_PROVIDER_SITE_OTHER): Payer: BC Managed Care – PPO | Admitting: Family Medicine

## 2014-01-29 ENCOUNTER — Ambulatory Visit: Payer: BC Managed Care – PPO

## 2014-01-29 VITALS — BP 114/62 | HR 72 | Temp 98.2°F | Resp 18 | Ht 65.0 in | Wt 201.0 lb

## 2014-01-29 DIAGNOSIS — R05 Cough: Secondary | ICD-10-CM

## 2014-01-29 DIAGNOSIS — R059 Cough, unspecified: Secondary | ICD-10-CM

## 2014-01-29 MED ORDER — HYDROCODONE-HOMATROPINE 5-1.5 MG/5ML PO SYRP: 5.0000 mL | ORAL_SOLUTION | Freq: Three times a day (TID) | ORAL | Status: DC | PRN

## 2014-01-29 MED ORDER — CEFDINIR 300 MG PO CAPS: 300.0000 mg | ORAL_CAPSULE | Freq: Two times a day (BID) | ORAL | Status: DC

## 2014-01-29 NOTE — Patient Instructions (Addendum)
Use the cough syrup as needed for cough but remember it can make you feel sleepy.  Let me know if you are not feeling better in the next few days- however in this case you can also fill and use the omnicef antibiotic.   Have a great time on your trip.   The ACIP recommends that both PCV13 and PPSV23 be given sequentially to  all adults aged ?30 years and to adults of any age who have the underlying   conditions listed below.  ? Prevnar first followed by Pneumovax in all patients over 15.   ? Those with underlying conditions warrant earlier vaccination with both   followed by pneumovax booster at 65 with no need for Prevnar booster.

## 2014-01-29 NOTE — Progress Notes (Signed)
[facility] [address] [place] [address] [phone]  Date:  01/29/2014   Name:  April [name]   DOB:  1950-12-20   MRN:  [id number]  PCP:  Lollie Sails, MD    Chief Complaint: Cough   History of Present Illness:  April [name] is a 63 y.o. very pleasant female patient who presents with the following:  She has been ill for about 3 days.  She has noted a cough, runny nose.  No fever.  They are flying out for vacation next week to [place] and then [place].  She noted a headache and ST, but this is now resolved.  Her sx are now mostly the cough. The cough is keeping her awake at night.  She is coughing up some mucus.    She has not taken any anti-pyretics yet today  She did have pneumonia following the flu in March- she seemed to recover fully from that illness.  She did well with omnicef at that time She is a former smoker  Patient Active Problem List   Diagnosis Date Noted  . Benign neoplasm of colon 08/25/2012  . Special screening for malignant neoplasms, colon 08/25/2012  . Mediastinal mass 08/24/2011  . URI (upper respiratory infection) 08/24/2011  . Hypothyroid 08/24/2011    Past Medical History  Diagnosis Date  . Obesity   . Hypothyroidism   . Insomnia   . Mineral deficiency   . Hypochlorhydria   . Hypercholesteremia   . Cataract   . Vitamin D deficiency   . Fatigue   . Candidiasis of intestine   . Vitamin D deficiency   . Radiculopathy, lumbar region   . Bronchitis   . Sleep apnea     cpap=?? SETTING  . Recurrent upper respiratory infection (URI)     COLD IN JAN....STILL HAS OCCAS. COUGHING  . Anemia   . Complication of anesthesia     pt states they said it took 3 attempts to get tube down-small esophagus    Past Surgical History  Procedure Laterality Date  . Laparoscopic cholecystectomy    . Gallbladder surgery    . Tonsillectomy    . Lung biopsy    . Mediasternotomy  10/31/2011    Procedure: MEDIASTERNOTOMY;   Surgeon: Grace Isaac, MD;  Location: [facility];  Service: Thoracic;  Laterality: N/A;  RESECTION OF THYMOMA, FULL STERNOTOMY  . Colonoscopy  08/25/2012    Procedure: COLONOSCOPY;  Surgeon: Ladene Artist, MD,FACG;  Location: [facility];  Service: Endoscopy;  Laterality: N/A;  . Stenotomy       STENOTOMY/THYMECTOMY    History  Substance Use Topics  . Smoking status: Former Smoker -- 1.00 packs/day for 17 years    Types: Cigarettes    Quit date: 09/26/1983  . Smokeless tobacco: Never Used  . Alcohol Use: No    Family History  Problem Relation Age of Onset  . Colon cancer Neg Hx     No Known Allergies  Medication list has been reviewed and updated.  Current Outpatient Prescriptions on File Prior to Visit  Medication Sig Dispense Refill  . albuterol (PROVENTIL HFA;VENTOLIN HFA) 108 (90 BASE) MCG/ACT inhaler Inhale 2 puffs into the lungs every 4 (four) hours as needed for wheezing or shortness of breath (cough, shortness of breath or wheezing.).  1 Inhaler  1  . Ascorbic Acid (VITAMIN C) 1000 MG tablet Take 1,000 mg by mouth daily.        Marland Kitchen BETAINE  PO Take 3 tablets by mouth 3 (three) times daily with meals.       . Cholecalciferol (VITAMIN D3) 5000 UNITS CAPS Take 1 tablet by mouth daily.       . Coenzyme Q10 (COQ10 PO) Take 1 capsule by mouth daily.      . Iodine Strong, Lugols, (IODINE STRONG PO) Take 2 capsules by mouth 2 (two) times daily.      Marland Kitchen liothyronine (CYTOMEL) 5 MCG tablet 2 tablets every morning and 1 tablet in the afternoon.      . Magnesium (MAGNESIUM GLYCINATE PLUS) 110 MG CAPS Take 2 capsules by mouth 2 (two) times daily.      . NON FORMULARY Take 2 capsules by mouth 3 (three) times daily with meals. Ultra nutrient supplement       . pravastatin (PRAVACHOL) 40 MG tablet Take 40 mg by mouth every evening.       . thyroid (ARMOUR) 60 MG tablet Take 90 mg by mouth daily before breakfast.       . vitamin A 25000 UNIT capsule Take 25,000 Units by mouth daily.        . benzonatate (TESSALON) 200 MG capsule Take 1 capsule (200 mg total) by mouth 3 (three) times daily as needed for cough.  13 capsule  0  . cefdinir (OMNICEF) 300 MG capsule Take 1 capsule (300 mg total) by mouth 2 (two) times daily.  20 capsule  0  . HYDROcodone-homatropine (HYCODAN) 5-1.5 MG/5ML syrup Take 5 mLs by mouth every 8 (eight) hours as needed for cough.  90 mL  0  . ipratropium (ATROVENT) 0.03 % nasal spray Use 1 or 2 sprays in each nostril every 6 or 8 hours as needed  30 mL  0   No current facility-administered medications on file prior to visit.    Review of Systems:  As per HPI- otherwise negative.   Physical Examination: Filed Vitals:   01/29/14 1343  BP: 114/62  Pulse: 72  Temp: 98.2 F (36.8 C)  Resp: 18   Filed Vitals:   01/29/14 1343  Height: 5\' 5"  (1.651 m)  Weight: 201 lb (91.173 kg)   Body mass index is 33.45 kg/(m^2). Ideal Body Weight: Weight in (lb) to have BMI = 25: 149.9  GEN: WDWN, NAD, Non-toxic, A & O x 3, overweight, looks well HEENT: Atraumatic, Normocephalic. Neck supple. No masses, No LAD.  Bilateral TM wnl, oropharynx normal.  PEERL,EOMI.   Ears and Nose: No external deformity. CV: RRR, No M/G/R. No JVD. No thrill. No extra heart sounds. PULM: CTA B, no wheezes, crackles, rhonchi. No retractions. No resp. distress. No accessory muscle use. ABD: S, NT, ND EXTR: No c/c/e NEURO Normal gait.  PSYCH: Normally interactive. Conversant. Not depressed or anxious appearing.  Calm demeanor.   [facility] reading (PRIMARY) by  Dr. Lorelei Pont. CXR: compared to 11/19/13 the previously noted infiltrate has resolved.  Normal x-ray   CHEST 2 VIEW  COMPARISON: 11/19/2013  FINDINGS: Cardiac shadow is within normal limits. The lungs are well aerated bilaterally. The previously seen left lower lobe infiltrate has resolved in the interval. No acute abnormality is noted. Postsurgical changes are again seen.  IMPRESSION: No active cardiopulmonary  disease.   Assessment and Plan: Cough - Plan: DG Chest 2 View, HYDROcodone-homatropine (HYCODAN) 5-1.5 MG/5ML syrup, cefdinir (OMNICEF) 300 MG capsule  Here today with a cough.  Recent pneumonia- she is worried she will get worse/ will not get better and they are going on a  trip in just over a week.  Given omnicef rx to hold- if not better in the next few days she will give me a call and start this medication- Sooner if worse.   Hycodan to use prn cough.  Cautioned regarding sedation.   She is interested in new pneumonia vaccine guidelines- went over these with her.   Signed Lamar Blinks, MD

## 2014-06-10 ENCOUNTER — Ambulatory Visit (INDEPENDENT_AMBULATORY_CARE_PROVIDER_SITE_OTHER): Payer: BC Managed Care – PPO | Admitting: Physician Assistant

## 2014-06-10 VITALS — BP 122/80 | HR 61 | Temp 98.0°F | Resp 17 | Ht 64.5 in | Wt 206.0 lb

## 2014-06-10 DIAGNOSIS — H6123 Impacted cerumen, bilateral: Secondary | ICD-10-CM

## 2014-06-10 NOTE — Progress Notes (Signed)
I was directly involved with the patient's care and agree with the physical, diagnosis and treatment plan.  

## 2014-06-10 NOTE — Progress Notes (Signed)
   Subjective:    Patient ID: April [name], female    DOB: [birth year], 63 y.o.   MRN: [id number]  HPI Patient presents to clinic with L sided ear fullness and decreased hearing. Believes R ear is on its way to feeling the same. Has happened in the past, but is no longer using Debrox. Denies Q-Tip use or recent illness.   Review of Systems  Constitutional: Negative for fever and fatigue.  HENT: Positive for hearing loss. Negative for congestion, ear discharge, ear pain (feels full.), rhinorrhea, sinus pressure, sneezing and sore throat.   Eyes: Negative for pain, discharge and itching.  Respiratory: Negative for cough, chest tightness and shortness of breath.   Cardiovascular: Negative for chest pain and palpitations.  Gastrointestinal: Negative for nausea, vomiting, abdominal pain, diarrhea, constipation and abdominal distention.  Musculoskeletal: Negative for neck pain and neck stiffness.  Neurological: Negative for dizziness, light-headedness and headaches.       Objective:   Physical Exam  Constitutional: She appears well-developed and well-nourished. No distress.  HENT:  Head: Normocephalic and atraumatic.  Right Ear: External ear normal.  Left Ear: External ear normal.  Nose: Nose normal.  Mouth/Throat: Oropharynx is clear and moist.  Eyes: Conjunctivae are normal. Pupils are equal, round, and reactive to light. Left eye exhibits no discharge.  Neck: Neck supple. No tracheal deviation present.  Cardiovascular: Normal rate and regular rhythm.  Exam reveals friction rub. Exam reveals no gallop.   No murmur heard. Pulmonary/Chest: Effort normal and breath sounds normal. She has no wheezes. She has no rales.  Abdominal: Soft. Bowel sounds are normal.  Lymphadenopathy:    She has no cervical adenopathy.  Neurological: She is alert.  Skin: Skin is warm and dry. No rash noted. No erythema. No pallor.   Blood pressure 122/80, pulse 61, temperature 98 F (36.7 C), temperature  source Oral, resp. rate 17, height 5' 4.5" (1.638 m), weight 206 lb (93.441 kg), SpO2 98.00%.      Assessment & Plan:  1. Cerumen impaction, bilateral Given colace drops. Irrigation performed. Impaction resolved.   Alveta Heimlich PA-C  [facility] 06/10/2014 4:18 PM

## 2014-06-10 NOTE — Patient Instructions (Signed)
Discourage use of Q-tips.  Can use Debrox as needed.

## 2014-10-07 ENCOUNTER — Ambulatory Visit: Payer: BC Managed Care – PPO | Admitting: Cardiothoracic Surgery

## 2014-10-21 ENCOUNTER — Ambulatory Visit: Payer: BC Managed Care – PPO | Admitting: Cardiothoracic Surgery

## 2014-10-27 ENCOUNTER — Other Ambulatory Visit: Payer: Self-pay | Admitting: Cardiothoracic Surgery

## 2014-10-27 DIAGNOSIS — J9859 Other diseases of mediastinum, not elsewhere classified: Secondary | ICD-10-CM

## 2014-10-28 ENCOUNTER — Ambulatory Visit (INDEPENDENT_AMBULATORY_CARE_PROVIDER_SITE_OTHER): Payer: Medicare Other | Admitting: Cardiothoracic Surgery

## 2014-10-28 ENCOUNTER — Encounter: Payer: Self-pay | Admitting: Cardiothoracic Surgery

## 2014-10-28 ENCOUNTER — Ambulatory Visit
Admission: RE | Admit: 2014-10-28 | Discharge: 2014-10-28 | Disposition: A | Discharge status: Home / Self Care | Payer: Medicare Other | Source: Ambulatory Visit | Attending: Cardiothoracic Surgery | Admitting: Cardiothoracic Surgery

## 2014-10-28 VITALS — BP 116/77 | HR 66 | Resp 20 | Ht 64.5 in | Wt 192.0 lb

## 2014-10-28 DIAGNOSIS — J9859 Other diseases of mediastinum, not elsewhere classified: Secondary | ICD-10-CM

## 2014-10-28 DIAGNOSIS — R918 Other nonspecific abnormal finding of lung field: Secondary | ICD-10-CM | POA: Diagnosis not present

## 2014-10-28 DIAGNOSIS — R222 Localized swelling, mass and lump, trunk: Secondary | ICD-10-CM

## 2014-10-28 DIAGNOSIS — D150 Benign neoplasm of thymus: Secondary | ICD-10-CM | POA: Diagnosis not present

## 2014-10-28 NOTE — Progress Notes (Signed)
[facility] #[id number] Date of Birth: [birth year]  Josue Hector, MD Lollie Sails, MD  Chief Complaint:   PostOp Follow Up Visit 10/31/2011  PREOPERATIVE DIAGNOSIS: Anterior mediastinal mass consistent with  thymoma.  POSTOPERATIVE DIAGNOSIS: Anterior mediastinal mass consistent with  thymoma.  SURGICAL PROCEDURE: Median sternotomy with resection of anterior  mediastinal tumor, probable well encapsulated thymoma.   History of Present Illness:      Patient's doing well now 3 years  following resection of an encapsulated benign-appearing thyoma. She denies any chest discomfort has returned to full activities. She denies any chest pain. Denies any new medical problems over the past year.   History  Smoking status  . Former Smoker -- 1.00 packs/day for 17 years  . Types: Cigarettes  . Quit date: 09/26/1983  Smokeless tobacco  . Never Used       No Known Allergies  Current Outpatient Prescriptions  Medication Sig Dispense Refill  . Ascorbic Acid (VITAMIN C) 1000 MG tablet Take 1,000 mg by mouth daily.      Marland Kitchen BETAINE PO Take 7 tablets by mouth 3 (three) times daily with meals.     . Cholecalciferol (VITAMIN D3) 5000 UNITS CAPS Take 1 tablet by mouth daily.     . Coenzyme Q10 (COQ10 PO) Take 1 capsule by mouth daily.    Marland Kitchen DHEA 50 MG CAPS Take by mouth daily.    . Iodine Strong, Lugols, (IODINE STRONG PO) Take 1 capsule by mouth 3 (three) times daily.     Marland Kitchen liothyronine (CYTOMEL) 5 MCG tablet 2 tablets every morning and 3 tablet in the afternoon.    . Magnesium (MAGNESIUM GLYCINATE PLUS) 110 MG CAPS Take 2 capsules by mouth at bedtime.     . NON FORMULARY Take 2 capsules by mouth 3 (three) times daily with meals. Ultra nutrient supplement    . OVER THE COUNTER MEDICATION Take 3 tablets by mouth daily. Mix RX supplement with each meal    . pravastatin (PRAVACHOL) 40 MG tablet Take 40 mg by mouth every evening.     . thyroid (ARMOUR)  60 MG tablet Take 90 mg by mouth daily before breakfast.     . vitamin A 25000 UNIT capsule Take 25,000 Units by mouth daily.      No current facility-administered medications for this visit.       Physical Exam: BP 116/77 mmHg  Pulse 66  Resp 20  Ht 5' 4.5" (1.638 m)  Wt 192 lb (87.091 kg)  BMI 32.46 kg/m2  SpO2 98%  General appearance: alert and cooperative Neurologic: intact Heart: regular rate and rhythm, S1, S2 normal, no murmur, click, rub or gallop and normal apical impulse Lungs: clear to auscultation bilaterally and normal percussion bilaterally Abdomen: soft, non-tender; bowel sounds normal; no masses,  no organomegaly Extremities: extremities normal, atraumatic, no cyanosis or edema and Homans sign is negative, no sign of DVT Wound: Sternum is stable and well healed There is no cervical supraclavicular axillary adenopathy  Diagnostic Studies & Laboratory data:         Recent Radiology Findings: Dg Chest 2 View  10/28/2014   CLINICAL DATA:  Thymoma resection 2013.  EXAM: CHEST  2 VIEW  COMPARISON:  01/29/2014.  FINDINGS: Mediastinum and hilar structures normal. Prior median sternotomy. Heart size normal. Lungs are clear. No pleural effusion or pneumothorax. No acute soft tissue or bony abnormality. Surgical clips right upper quadrant.  IMPRESSION: No active  cardiopulmonary disease.   Electronically Signed   By: Marcello Moores  Register   On: 10/28/2014 10:45    I have independently reviewed the above radiology studies  and reviewed the findings with the patient.  Recent Labs: Lab Results  Component Value Date   WBC 11.2* 11/12/2013   HGB 13.6 11/12/2013   HCT 42.3 11/12/2013   PLT 189 11/02/2011   GLUCOSE 107* 11/02/2011   ALT 23 10/29/2011   AST 25 10/29/2011   NA 136 11/02/2011   K 3.8 11/02/2011   CL 102 11/02/2011   CREATININE 0.58 11/02/2011   BUN 10 11/02/2011   CO2 27 11/02/2011   INR 1.02 10/29/2011   Patient does not get flu shots and has not had one  this year, she has had colonoscopy,    Assessment / Plan:   Good progress following excision of large anterior mediastinal mass- which was a benign thymoma appearing at the time of resection No evidence of recurrence on CT scan last year. Patient is concerned about radiation exposure and did not wish to have a followup CT scan.  Chest x-ray done today is clear We will plan the see her in followup in one year with a PA and lateral chest x-ray.   Lucian Baswell B 10/28/2014 11:56 AM

## 2014-11-29 DIAGNOSIS — E8881 Metabolic syndrome: Secondary | ICD-10-CM | POA: Diagnosis not present

## 2014-11-29 DIAGNOSIS — E509 Vitamin A deficiency, unspecified: Secondary | ICD-10-CM | POA: Diagnosis not present

## 2014-11-29 DIAGNOSIS — E039 Hypothyroidism, unspecified: Secondary | ICD-10-CM | POA: Diagnosis not present

## 2015-03-22 DIAGNOSIS — E039 Hypothyroidism, unspecified: Secondary | ICD-10-CM | POA: Diagnosis not present

## 2015-03-22 DIAGNOSIS — E509 Vitamin A deficiency, unspecified: Secondary | ICD-10-CM | POA: Diagnosis not present

## 2015-03-31 ENCOUNTER — Ambulatory Visit (INDEPENDENT_AMBULATORY_CARE_PROVIDER_SITE_OTHER): Payer: Medicare Other | Admitting: Family Medicine

## 2015-03-31 VITALS — BP 128/80 | HR 71 | Temp 97.7°F | Resp 16 | Ht 64.0 in | Wt 186.0 lb

## 2015-03-31 DIAGNOSIS — H6123 Impacted cerumen, bilateral: Secondary | ICD-10-CM

## 2015-03-31 NOTE — Patient Instructions (Signed)
Cerumen Impaction °A cerumen impaction is when the wax in your ear forms a plug. This plug usually causes reduced hearing. Sometimes it also causes an earache or dizziness. Removing a cerumen impaction can be difficult and painful. The wax sticks to the ear canal. The canal is sensitive and bleeds easily. If you try to remove a heavy wax buildup with a cotton tipped swab, you may push it in further. °Irrigation with water, suction, and small ear curettes may be used to clear out the wax. If the impaction is fixed to the skin in the ear canal, ear drops may be needed for a few days to loosen the wax. People who build up a lot of wax frequently can use ear wax removal products available in your local drugstore. °SEEK MEDICAL CARE IF:  °You develop an earache, increased hearing loss, or marked dizziness. °Document Released: 10/04/2004 Document Revised: 11/19/2011 Document Reviewed: 11/24/2009 °ExitCare® Patient Information ©2015 [employer]. This information is not intended to replace advice given to you by your health care provider. Make sure you discuss any questions you have with your health care provider. ° °

## 2015-03-31 NOTE — Progress Notes (Signed)
This chart was scribed for Robyn Haber, MD by Moises Blood, medical scribe at [facility].The patient was seen in exam room 2 and the patient's care was started at 10:51 AM.  Patient ID: April [name] MRN: [id number], DOB: 1951-02-20, 64 y.o. Date of Encounter: 03/31/2015  Primary Physician: Lollie Sails, MD  Chief Complaint:  Chief Complaint  Patient presents with  . Cerumen Impaction    both ears, x 1 week     HPI:  April [name] is a 64 y.o. female who presents to [facility] complaining of impacted cerumen that started a week ago.  Pt states that this has affected her hearing. She says it has started to become painful yesterday. She's had similar symptoms multiple times in the past.  Patient is already tried Debrox to loosen the wax  Past Medical History  Diagnosis Date  . Obesity   . Hypothyroidism   . Insomnia   . Mineral deficiency   . Hypochlorhydria   . Hypercholesteremia   . Cataract   . Vitamin D deficiency   . Fatigue   . Candidiasis of intestine   . Vitamin D deficiency   . Radiculopathy, lumbar region   . Bronchitis   . Sleep apnea     cpap=?? SETTING  . Recurrent upper respiratory infection (URI)     COLD IN JAN....STILL HAS OCCAS. COUGHING  . Anemia   . Complication of anesthesia     pt states they said it took 3 attempts to get tube down-small esophagus     Home Meds: Prior to Admission medications   Medication Sig Start Date End Date Taking? Authorizing Provider  Ascorbic Acid (VITAMIN C) 1000 MG tablet Take 1,000 mg by mouth daily.     Yes Historical Provider, MD  BETAINE PO Take 7 tablets by mouth 3 (three) times daily with meals.    Yes Historical Provider, MD  Cholecalciferol (VITAMIN D3) 5000 UNITS CAPS Take 1 tablet by mouth daily.    Yes Historical Provider, MD  Coenzyme Q10 (COQ10 PO) Take 1 capsule by mouth daily.   Yes Historical Provider, MD  DHEA 50 MG CAPS Take by mouth daily.   Yes  Historical Provider, MD  Iodine Strong, Lugols, (IODINE STRONG PO) Take 1 capsule by mouth 3 (three) times daily.    Yes Historical Provider, MD  liothyronine (CYTOMEL) 5 MCG tablet 2 tablets every morning and 3 tablet in the afternoon.   Yes Historical Provider, MD  Magnesium (MAGNESIUM GLYCINATE PLUS) 110 MG CAPS Take 2 capsules by mouth at bedtime.    Yes Historical Provider, MD  NON FORMULARY Take 2 capsules by mouth 3 (three) times daily with meals. Ultra nutrient supplement   Yes Historical Provider, MD  OVER THE COUNTER MEDICATION Take 3 tablets by mouth daily. Mix RX supplement with each meal   Yes Historical Provider, MD  pravastatin (PRAVACHOL) 40 MG tablet Take 40 mg by mouth every evening.    Yes Historical Provider, MD  thyroid (ARMOUR) 60 MG tablet Take 90 mg by mouth daily before breakfast.    Yes Historical Provider, MD  vitamin A 25000 UNIT capsule Take 25,000 Units by mouth daily.    Yes Historical Provider, MD    Allergies: No Known Allergies  History   Social History  . Marital Status: Married    Spouse Name: N/A  . Number of Children: N/A  . Years of Education: N/A   Occupational History  .  Not on file.   Social History Main Topics  . Smoking status: Former Smoker -- 1.00 packs/day for 17 years    Types: Cigarettes    Quit date: 09/26/1983  . Smokeless tobacco: Never Used  . Alcohol Use: No  . Drug Use: No  . Sexual Activity: Not on file   Other Topics Concern  . Not on file   Social History Narrative     Review of Systems: Constitutional: negative for chills, fever, night sweats, weight changes, or fatigue  HEENT: negative for vision changes, congestion, rhinorrhea, ST, epistaxis, or sinus pressure; positive for some hearing loss, positive for ear pain Cardiovascular: negative for chest pain or palpitations Respiratory: negative for hemoptysis, wheezing, shortness of breath, or cough Abdominal: negative for abdominal pain, nausea, vomiting, diarrhea,  or constipation Dermatological: negative for rash Neurologic: negative for headache, dizziness, or syncope All other systems reviewed and are otherwise negative with the exception to those above and in the HPI.  Physical Exam: Blood pressure 128/80, pulse 71, temperature 97.7 F (36.5 C), temperature source Oral, resp. rate 16, height 5\' 4"  (1.626 m), weight 186 lb (84.369 kg), SpO2 98 %., Body mass index is 31.91 kg/(m^2). General: Well developed, well nourished, in no acute distress. Head: Normocephalic, atraumatic, eyes without discharge, sclera non-icteric, nares are without discharge. Bilateral auditory canals clear, TM's are without perforation, pearly grey and translucent with reflective cone of light bilaterally. Oral cavity moist, posterior pharynx without exudate, erythema, peritonsillar abscess, or post nasal drip.  Cerumen impaction bilaterally Neuro: Alert and oriented X 3. Moves all extremities spontaneously. Gait is normal. CNII-XII grossly in tact. Psych:  Responds to questions appropriately with a normal affect.    ASSESSMENT AND PLAN:  64 y.o. year old female with  This chart was scribed in my presence and reviewed by me personally.    ICD-9-CM ICD-10-CM   1. Cerumen impaction, bilateral 380.4 H61.23       Signed, Robyn Haber, MD 03/31/2015 10:51 AM

## 2015-04-04 ENCOUNTER — Ambulatory Visit (INDEPENDENT_AMBULATORY_CARE_PROVIDER_SITE_OTHER): Payer: Medicare Other | Admitting: Family Medicine

## 2015-04-04 VITALS — BP 118/68 | HR 78 | Temp 98.7°F | Resp 16 | Ht 64.75 in | Wt 188.0 lb

## 2015-04-04 DIAGNOSIS — M79661 Pain in right lower leg: Secondary | ICD-10-CM | POA: Diagnosis not present

## 2015-04-04 DIAGNOSIS — M79609 Pain in unspecified limb: Secondary | ICD-10-CM | POA: Diagnosis not present

## 2015-04-04 NOTE — Patient Instructions (Signed)
It does not appear that you have a blood clot at this time, but we will check the blood tests to screen for this. If it is positive then he will need an ultrasound and we can arrange this. He should receive a phone call years managed tomorrow morning regarding results. You may have had a strain to your calf muscle, and for this it is okay to do gentle stretches, heating pad, and Tylenol over-the-counter if needed.  If it is a calf strain and should start improving this week.  I included other information below if this is a calf strain. Return to the clinic or go to the nearest emergency room if any of your symptoms worsen or new symptoms occur.   Muscle Strain A muscle strain is an injury that occurs when a muscle is stretched beyond its normal length. Usually a small number of muscle fibers are torn when this happens. Muscle strain is rated in degrees. First-degree strains have the least amount of muscle fiber tearing and pain. Second-degree and third-degree strains have increasingly more tearing and pain.  Usually, recovery from muscle strain takes 1-2 weeks. Complete healing takes 5-6 weeks.  CAUSES  Muscle strain happens when a sudden, violent force placed on a muscle stretches it too far. This may occur with lifting, sports, or a fall.  RISK FACTORS Muscle strain is especially common in athletes.  SIGNS AND SYMPTOMS At the site of the muscle strain, there may be:  Pain.  Bruising.  Swelling.  Difficulty using the muscle due to pain or lack of normal function. DIAGNOSIS  Your health care provider will perform a physical exam and ask about your medical history. TREATMENT  Often, the best treatment for a muscle strain is resting, icing, and applying cold compresses to the injured area.  HOME CARE INSTRUCTIONS   Use the PRICE method of treatment to promote muscle healing during the first 2-3 days after your injury. The PRICE method involves:  Protecting the muscle from being injured  again.  Restricting your activity and resting the injured body part.  Icing your injury. To do this, put ice in a plastic bag. Place a towel between your skin and the bag. Then, apply the ice and leave it on from 15-20 minutes each hour. After the third day, switch to moist heat packs.  Apply compression to the injured area with a splint or elastic bandage. Be careful not to wrap it too tightly. This may interfere with blood circulation or increase swelling.  Elevate the injured body part above the level of your heart as often as you can.  Only take over-the-counter or prescription medicines for pain, discomfort, or fever as directed by your health care provider.  Warming up prior to exercise helps to prevent future muscle strains. SEEK MEDICAL CARE IF:   You have increasing pain or swelling in the injured area.  You have numbness, tingling, or a significant loss of strength in the injured area. MAKE SURE YOU:   Understand these instructions.  Will watch your condition.  Will get help right away if you are not doing well or get worse. Document Released: 08/27/2005 Document Revised: 06/17/2013 Document Reviewed: 03/26/2013 [facility] Patient Information 2015 [place], [place]. This information is not intended to replace advice given to you by your health care provider. Make sure you discuss any questions you have with your health care provider.

## 2015-04-04 NOTE — Progress Notes (Deleted)
   04/04/2015 at 6:26 PM  April [name] / DOB: 1950-09-18 / MRN: [id number]  The patient has Mediastinal mass; URI (upper respiratory infection); Hypothyroid; Benign neoplasm of colon; and Special screening for malignant neoplasms, colon on her problem list.  SUBJECTIVE  Chief complaint: Leg Pain    She  has a past medical history of Obesity; Hypothyroidism; Insomnia; Mineral deficiency; Hypochlorhydria; Hypercholesteremia; Cataract; Vitamin D deficiency; Fatigue; Candidiasis of intestine; Vitamin D deficiency; Radiculopathy, lumbar region; Bronchitis; Sleep apnea; Recurrent upper respiratory infection (URI); Anemia; and Complication of anesthesia.    Medications reviewed and updated by myself where necessary, and exist elsewhere in the encounter.   Ms. Lueth has No Known Allergies. She  reports that she quit smoking about 31 years ago. Her smoking use included Cigarettes. She has a 17 pack-year smoking history. She has never used smokeless tobacco. She reports that she does not drink alcohol or use illicit drugs. She  has no sexual activity history on file. The patient  has past surgical history that includes Laparoscopic cholecystectomy; Gallbladder surgery; Tonsillectomy; Lung biopsy; Mediasternotomy (10/31/2011); Colonoscopy (08/25/2012); and stenotomy .  Her family history is negative for Colon cancer.  ROS  OBJECTIVE  Her  height is 5' 4.75" (1.645 m) and weight is 188 lb (85.276 kg). Her oral temperature is 98.7 F (37.1 C). Her blood pressure is 118/68 and her pulse is 78. Her respiration is 16 and oxygen saturation is 98%.  The patient's body mass index is 31.51 kg/(m^2).  Physical Exam  No results found for this or any previous visit (from the past 24 hour(s)).  ASSESSMENT & PLAN  There are no diagnoses linked to this encounter.  The patient was advised to call or come back to clinic if she does not see an improvement in symptoms, or worsens with the above plan.   Philis Fendt, MHS, PA-C [facility] 04/04/2015 6:26 PM

## 2015-04-04 NOTE — Progress Notes (Addendum)
Subjective:   This chart was scribed for April Ray, MD by Erling Conte, Medical Scribe. This patient was seen in Room 3 and the patient's care was started at 6:30 PM.   Patient ID: April [name], female    DOB: October 21, 1950, 64 y.o.   MRN: [id number]  Chief Complaint  Patient presents with  . Leg Pain    right, x 2 days. Back of calf    HPI April [name] is a 64 y.o. female. Pt is here for right calf pain onset 2 days ago. She states the pain started off as mild but the pain gradually increased over time. Pt reports that the pain is in the upper part of the back of right calf. She states the pain currently even hurts when she is lying in bed not doing anything. She denies any h/o knee issues. She states when she completely straightens her right leg she feels pain in her calf. She states she took an aspirin and has kept her leg elevated. She denies any known injury. She states that in the past she has had generalized body aches after traveling. Pt notes her sister has had a blood clot in the past but she never has. Her last long car ride was 2 months ago and her recent air travel was 1 month ago. She reports the air travel was only a few hours up to [place]. No recent surgery, last surgery was 3 years ago. Not recently bedridden. Pt is former smoker. She denies any chest pain, SOB, or leg swelling.  Patient Active Problem List   Diagnosis Date Noted  . Benign neoplasm of colon 08/25/2012  . Special screening for malignant neoplasms, colon 08/25/2012  . Mediastinal mass 08/24/2011  . URI (upper respiratory infection) 08/24/2011  . Hypothyroid 08/24/2011   Past Medical History  Diagnosis Date  . Obesity   . Hypothyroidism   . Insomnia   . Mineral deficiency   . Hypochlorhydria   . Hypercholesteremia   . Cataract   . Vitamin D deficiency   . Fatigue   . Candidiasis of intestine   . Vitamin D deficiency   . Radiculopathy, lumbar region   . Bronchitis   . Sleep apnea     cpap=??  SETTING  . Recurrent upper respiratory infection (URI)     COLD IN JAN....STILL HAS OCCAS. COUGHING  . Anemia   . Complication of anesthesia     pt states they said it took 3 attempts to get tube down-small esophagus   Past Surgical History  Procedure Laterality Date  . Laparoscopic cholecystectomy    . Gallbladder surgery    . Tonsillectomy    . Lung biopsy    . Mediasternotomy  10/31/2011    Procedure: MEDIASTERNOTOMY;  Surgeon: Grace Isaac, MD;  Location: [facility];  Service: Thoracic;  Laterality: N/A;  RESECTION OF THYMOMA, FULL STERNOTOMY  . Colonoscopy  08/25/2012    Procedure: COLONOSCOPY;  Surgeon: Ladene Artist, MD,FACG;  Location: [facility];  Service: Endoscopy;  Laterality: N/A;  . Stenotomy       STENOTOMY/THYMECTOMY   No Known Allergies Prior to Admission medications   Medication Sig Start Date End Date Taking? Authorizing Provider  aspirin 81 MG tablet Take 81 mg by mouth daily. Only when traveling   Yes Historical Provider, MD  BETAINE PO Take 7 tablets by mouth 3 (three) times daily with meals.    Yes Historical Provider, MD  Cholecalciferol (VITAMIN D3) 5000 UNITS CAPS  Take 1 tablet by mouth daily.    Yes Historical Provider, MD  Coenzyme Q10 (COQ10 PO) Take 1 capsule by mouth daily.   Yes Historical Provider, MD  DHEA 50 MG CAPS Take by mouth daily.   Yes Historical Provider, MD  Iodine Strong, Lugols, (IODINE STRONG PO) Take 1 capsule by mouth 3 (three) times daily.    Yes Historical Provider, MD  liothyronine (CYTOMEL) 5 MCG tablet 2 tablets every morning and 3 tablet in the afternoon.   Yes Historical Provider, MD  Magnesium (MAGNESIUM GLYCINATE PLUS) 110 MG CAPS Take 2 capsules by mouth at bedtime.    Yes Historical Provider, MD  metFORMIN (GLUCOPHAGE) 500 MG tablet Take by mouth 1 day or 1 dose.   Yes Historical Provider, MD  NON FORMULARY Take 2 capsules by mouth 3 (three) times daily with meals. Ultra nutrient supplement   Yes Historical Provider, MD    OVER THE COUNTER MEDICATION Take 3 tablets by mouth daily. Mix RX supplement with each meal   Yes Historical Provider, MD  pravastatin (PRAVACHOL) 40 MG tablet Take 40 mg by mouth every evening.    Yes Historical Provider, MD  thyroid (ARMOUR) 60 MG tablet Take 90 mg by mouth daily before breakfast.    Yes Historical Provider, MD  vitamin A 25000 UNIT capsule Take 25,000 Units by mouth daily.    Yes Historical Provider, MD  Ascorbic Acid (VITAMIN C) 1000 MG tablet Take 1,000 mg by mouth daily.      Historical Provider, MD   History   Social History  . Marital Status: Married    Spouse Name: N/A  . Number of Children: N/A  . Years of Education: N/A   Occupational History  . Not on file.   Social History Main Topics  . Smoking status: Former Smoker -- 1.00 packs/day for 17 years    Types: Cigarettes    Quit date: 09/26/1983  . Smokeless tobacco: Never Used  . Alcohol Use: No  . Drug Use: No  . Sexual Activity: Not on file   Other Topics Concern  . Not on file   Social History Narrative    Review of Systems     Objective:   Physical Exam  Constitutional: She is oriented to person, place, and time. She appears well-developed and well-nourished. No distress.  HENT:  Head: Normocephalic and atraumatic.  Eyes: Conjunctivae and EOM are normal.  Neck: Neck supple. No tracheal deviation present.  Cardiovascular: Normal rate, regular rhythm and normal heart sounds.  Exam reveals no gallop and no friction rub.   No murmur heard. Pulmonary/Chest: Effort normal and breath sounds normal. No respiratory distress. She has no decreased breath sounds. She has no wheezes. She has no rhonchi. She has no rales.  Musculoskeletal: Normal range of motion.       Right lower leg: She exhibits no swelling.       Left lower leg: She exhibits no swelling.  15 cm below patella left calf measures 40 cm, equal on right at 40 cm Left calf: Positive Homens. Tenderness along medial upper  gastrocnemius, no defect or palpable cord Left Knee: No effusion, full ROM. No pain along joint line  Neurological: She is alert and oriented to person, place, and time.  Skin: Skin is warm and dry.  Psychiatric: She has a normal mood and affect. Her behavior is normal.  Nursing note and vitals reviewed.     Filed Vitals:   04/04/15 1723  BP: 118/68  Pulse: 78  Temp: 98.7 F (37.1 C)  TempSrc: Oral  Resp: 16  Height: 5' 4.75" (1.645 m)  Weight: 188 lb (85.276 kg)  SpO2: 98%       Assessment & Plan:   April [name] is a 64 y.o. female Calf pain, right - Plan: D-dimer, quantitative (not at [facility])  Suspected overuse/strain based on change in activity day prior, but will check d-dimer and if elevated plan on ultrasound to rule out DVT. No known current risk factors for DVT although travel to [place] and back most recently 2 months ago as well as airline travel one month ago.  Symptomatic care for a calf strain discussed, and RTC/ER precautions given.  Meds ordered this encounter  Medications  . metFORMIN (GLUCOPHAGE) 500 MG tablet    Sig: Take by mouth 1 day or 1 dose.  Marland Kitchen aspirin 81 MG tablet    Sig: Take 81 mg by mouth daily. Only when traveling   Patient Instructions  It does not appear that you have a blood clot at this time, but we will check the blood tests to screen for this. If it is positive then he will need an ultrasound and we can arrange this. He should receive a phone call years managed tomorrow morning regarding results. You may have had a strain to your calf muscle, and for this it is okay to do gentle stretches, heating pad, and Tylenol over-the-counter if needed.  If it is a calf strain and should start improving this week.  I included other information below if this is a calf strain. Return to the clinic or go to the nearest emergency room if any of your symptoms worsen or new symptoms occur.   Muscle Strain A muscle strain is an injury that occurs when a muscle  is stretched beyond its normal length. Usually a small number of muscle fibers are torn when this happens. Muscle strain is rated in degrees. First-degree strains have the least amount of muscle fiber tearing and pain. Second-degree and third-degree strains have increasingly more tearing and pain.  Usually, recovery from muscle strain takes 1-2 weeks. Complete healing takes 5-6 weeks.  CAUSES  Muscle strain happens when a sudden, violent force placed on a muscle stretches it too far. This may occur with lifting, sports, or a fall.  RISK FACTORS Muscle strain is especially common in athletes.  SIGNS AND SYMPTOMS At the site of the muscle strain, there may be:  Pain.  Bruising.  Swelling.  Difficulty using the muscle due to pain or lack of normal function. DIAGNOSIS  Your health care provider will perform a physical exam and ask about your medical history. TREATMENT  Often, the best treatment for a muscle strain is resting, icing, and applying cold compresses to the injured area.  HOME CARE INSTRUCTIONS   Use the PRICE method of treatment to promote muscle healing during the first 2-3 days after your injury. The PRICE method involves:  Protecting the muscle from being injured again.  Restricting your activity and resting the injured body part.  Icing your injury. To do this, put ice in a plastic bag. Place a towel between your skin and the bag. Then, apply the ice and leave it on from 15-20 minutes each hour. After the third day, switch to moist heat packs.  Apply compression to the injured area with a splint or elastic bandage. Be careful not to wrap it too tightly. This may interfere with blood circulation or increase swelling.  Elevate the injured body part above the level of your heart as often as you can.  Only take over-the-counter or prescription medicines for pain, discomfort, or fever as directed by your health care provider.  Warming up prior to exercise helps to prevent  future muscle strains. SEEK MEDICAL CARE IF:   You have increasing pain or swelling in the injured area.  You have numbness, tingling, or a significant loss of strength in the injured area. MAKE SURE YOU:   Understand these instructions.  Will watch your condition.  Will get help right away if you are not doing well or get worse. Document Released: 08/27/2005 Document Revised: 06/17/2013 Document Reviewed: 03/26/2013 [facility] Patient Information 2015 [place], [place]. This information is not intended to replace advice given to you by your health care provider. Make sure you discuss any questions you have with your health care provider.     I personally performed the services described in this documentation, which was scribed in my presence. The recorded information has been reviewed and considered, and addended by me as needed.

## 2015-04-05 LAB — D-DIMER, QUANTITATIVE: D-Dimer, Quant: 0.35 ug/mL-FEU (ref 0.00–0.48)

## 2015-05-30 DIAGNOSIS — E509 Vitamin A deficiency, unspecified: Secondary | ICD-10-CM | POA: Diagnosis not present

## 2015-05-30 DIAGNOSIS — E8840 Mitochondrial metabolism disorder, unspecified: Secondary | ICD-10-CM | POA: Diagnosis not present

## 2015-05-30 DIAGNOSIS — E039 Hypothyroidism, unspecified: Secondary | ICD-10-CM | POA: Diagnosis not present

## 2015-05-30 DIAGNOSIS — E780 Pure hypercholesterolemia: Secondary | ICD-10-CM | POA: Diagnosis not present

## 2015-05-30 DIAGNOSIS — E8881 Metabolic syndrome: Secondary | ICD-10-CM | POA: Diagnosis not present

## 2015-06-20 DIAGNOSIS — E119 Type 2 diabetes mellitus without complications: Secondary | ICD-10-CM | POA: Diagnosis not present

## 2015-06-20 DIAGNOSIS — H5213 Myopia, bilateral: Secondary | ICD-10-CM | POA: Diagnosis not present

## 2015-06-20 DIAGNOSIS — H40013 Open angle with borderline findings, low risk, bilateral: Secondary | ICD-10-CM | POA: Diagnosis not present

## 2015-06-23 ENCOUNTER — Ambulatory Visit (INDEPENDENT_AMBULATORY_CARE_PROVIDER_SITE_OTHER): Payer: Medicare Other | Admitting: Physician Assistant

## 2015-06-23 VITALS — BP 116/68 | HR 94 | Temp 98.6°F | Resp 18 | Ht 64.0 in | Wt 185.2 lb

## 2015-06-23 DIAGNOSIS — J069 Acute upper respiratory infection, unspecified: Secondary | ICD-10-CM | POA: Diagnosis not present

## 2015-06-23 MED ORDER — AZITHROMYCIN 250 MG PO TABS: ORAL_TABLET | ORAL | Status: DC

## 2015-06-23 MED ORDER — HYDROCODONE-HOMATROPINE 5-1.5 MG/5ML PO SYRP: 5.0000 mL | ORAL_SOLUTION | Freq: Three times a day (TID) | ORAL | Status: DC | PRN

## 2015-06-23 NOTE — Patient Instructions (Signed)
You symptoms are following a classic viral pattern.  There are risk associated with antibiotic use, particularly if there is no indication for treatment.  I will prescribe and if in three days you are no better with symptomatic treatment then please fill at that time.  Please take two ibuprofen every eight hours.  Please take one claritin daily.  Please use the cough syrup as needed, however this may be best taken at night because it will make you drowsy.

## 2015-06-23 NOTE — Progress Notes (Signed)
06/23/2015 at 1:19 PM  April [name] / DOB: 1950-12-02 / MRN: [id number]  The patient has Hypothyroid and Special screening for malignant neoplasms, colon on her problem list.  SUBJECTIVE  April [name] is a 64 y.o. female complaining of coryza, productive cough and sore throat that started 8 days ago. The sore throat has resolved.  Associated symptoms include no other symptoms today, and she denies fever, difficulty breathing, headache and jaw pain.The patient symptoms are improving. Treatments tried thus far include cough suppressant of choice with fair  relief. She reports sick contacts. .  She  has a past medical history of Obesity; Hypothyroidism; Insomnia; Mineral deficiency; Hypochlorhydria; Hypercholesteremia; Cataract; Vitamin D deficiency; Fatigue; Candidiasis of intestine; Vitamin D deficiency; Radiculopathy, lumbar region; Bronchitis; Sleep apnea; Recurrent upper respiratory infection (URI); Anemia; and Complication of anesthesia.    Medications reviewed and updated by myself where necessary, and exist elsewhere in the encounter.   April [name] has No Known Allergies. She  reports that she quit smoking about 31 years ago. Her smoking use included Cigarettes. She has a 17 pack-year smoking history. She has never used smokeless tobacco. She reports that she does not drink alcohol or use illicit drugs. She  has no sexual activity history on file. The patient  has past surgical history that includes Laparoscopic cholecystectomy; Gallbladder surgery; Tonsillectomy; Lung biopsy; Mediasternotomy (10/31/2011); Colonoscopy (08/25/2012); and stenotomy .  Her family history is negative for Colon cancer.  Review of Systems  Constitutional: Negative for fever and chills.  Respiratory: Negative for shortness of breath.   Cardiovascular: Negative for chest pain.  Gastrointestinal: Negative for nausea and abdominal pain.  Genitourinary: Negative.   Skin: Negative for rash.  Neurological: Negative for  dizziness and headaches.    OBJECTIVE  Her  height is 5\' 4"  (1.626 m) and weight is 185 lb 3.2 oz (84.006 kg). Her oral temperature is 98.6 F (37 C). Her blood pressure is 116/68 and her pulse is 94. Her respiration is 18 and oxygen saturation is 98%.  The patient's body mass index is 31.77 kg/(m^2).  Physical Exam  Constitutional: She is oriented to person, place, and time. She appears well-developed and well-nourished. No distress.  HENT:  Right Ear: Hearing, tympanic membrane, external ear and ear canal normal.  Left Ear: Hearing, tympanic membrane, external ear and ear canal normal.  Nose: Mucosal edema present. Right sinus exhibits no maxillary sinus tenderness and no frontal sinus tenderness. Left sinus exhibits no maxillary sinus tenderness and no frontal sinus tenderness.  Mouth/Throat: Uvula is midline, oropharynx is clear and moist and mucous membranes are normal.  Eyes: Pupils are equal, round, and reactive to light.  Cardiovascular: Normal rate, regular rhythm and normal heart sounds.   Respiratory: Effort normal and breath sounds normal. No respiratory distress. She has no wheezes. She has no rales.  GI: She exhibits no distension.  Neurological: She is alert and oriented to person, place, and time.  Skin: Skin is warm and dry. She is not diaphoretic.  Psychiatric: She has a normal mood and affect. Her behavior is normal. Judgment and thought content normal.    No results found for this or any previous visit (from the past 24 hour(s)).  ASSESSMENT & PLAN  April [name] was seen today for cough.  Diagnoses and all orders for this visit:  Acute URI of multiple sites: her symptoms appear viral in nature.  Advised that she fill antibiotic in 3 days or sooner if her symptoms change.   -  azithromycin (ZITHROMAX) 250 MG tablet; Take 2 tabs PO x 1 dose, then 1 tab PO QD x 4 days. Please try symptomatic treatments first and fill in three days if not better. -      HYDROcodone-homatropine (HYCODAN) 5-1.5 MG/5ML syrup; Take 5 mLs by mouth every 8 (eight) hours as needed for cough.   The patient was advised to call or come back to clinic if she does not see an improvement in symptoms, or worsens with the above plan.   Philis Fendt, MHS, PA-C [facility] 06/23/2015 1:19 PM

## 2015-07-29 ENCOUNTER — Encounter (HOSPITAL_BASED_OUTPATIENT_CLINIC_OR_DEPARTMENT_OTHER): Payer: Medicare Other

## 2015-08-01 ENCOUNTER — Ambulatory Visit (HOSPITAL_BASED_OUTPATIENT_CLINIC_OR_DEPARTMENT_OTHER): Payer: Medicare Other | Attending: Internal Medicine

## 2015-08-01 VITALS — Ht 63.5 in | Wt 185.0 lb

## 2015-08-01 DIAGNOSIS — G4700 Insomnia, unspecified: Secondary | ICD-10-CM | POA: Diagnosis not present

## 2015-08-01 DIAGNOSIS — R0683 Snoring: Secondary | ICD-10-CM | POA: Insufficient documentation

## 2015-08-01 DIAGNOSIS — R5383 Other fatigue: Secondary | ICD-10-CM | POA: Insufficient documentation

## 2015-08-01 DIAGNOSIS — G4733 Obstructive sleep apnea (adult) (pediatric): Secondary | ICD-10-CM | POA: Diagnosis not present

## 2015-08-06 DIAGNOSIS — G4733 Obstructive sleep apnea (adult) (pediatric): Secondary | ICD-10-CM | POA: Diagnosis not present

## 2015-08-06 NOTE — Progress Notes (Signed)
  Patient Name: April [name], April [name] Date: 08/01/2015 Gender: Female D.O.B: [birth year] Age (years): 64 Referring Provider: Lollie Sails Height (inches): 64 Interpreting Physician: Baird Lyons MD, ABSM Weight (lbs): 185 RPSGT: Joni Reining BMI: 32 MRN: [id number] Neck Size: 14.50 CLINICAL INFORMATION Sleep Study Type: NPSG   Indication for sleep study: Fatigue, OSA   Epworth Sleepiness Score: 7/24   SLEEP STUDY TECHNIQUE As per the AASM Manual for the Scoring of Sleep and Associated Events v2.3 (April 2016) with a hypopnea requiring 4% desaturations. The channels recorded and monitored were frontal, central and occipital EEG, electrooculogram (EOG), submentalis EMG (chin), nasal and oral airflow, thoracic and abdominal wall motion, anterior tibialis EMG, snore microphone, electrocardiogram, and pulse oximetry. MEDICATIONS Patient's medications include: charted for review. Medications self-administered by patient during sleep study : No sleep medicine administered . SLEEP ARCHITECTURE The study was initiated at 9:45:59 PM and ended at 4:33:16 AM. Sleep onset time was 87.3 minutes and the sleep efficiency was 46.5%. The total sleep time was 189.4 minutes. Stage REM latency was 247.5 minutes. The patient spent 1.58% of the night in stage N1 sleep, 63.58% in stage N2 sleep, 29.56% in stage N3 and 5.28% in REM. Alpha intrusion was absent. Supine sleep was 11.35%. Wake after sleep onset 130.5 minutes  RESPIRATORY PARAMETERS The overall apnea/hypopnea index (AHI) was 6.3 per hour. There were 1 total apneas, including 1 obstructive, 0 central and 0 mixed apneas. There were 19 hypopneas and 16 RERAs. The AHI during Stage REM sleep was 18.0 per hour. AHI while supine was 5.6 per hour. The mean oxygen saturation was 92.89%. The minimum SpO2 during sleep was 87.00%. Loud snoring was noted during this study.  CARDIAC DATA The 2 lead EKG demonstrated sinus rhythm. The mean heart  rate was 64.66 beats per minute. Other EKG findings include: None.  LEG MOVEMENT DATA The total PLMS were 78 with a resulting PLMS index of 24.70. Associated arousal with leg movement index was 9.2 .  IMPRESSIONS - Mild obstructive sleep apnea occurred during this study (AHI = 6.3/h). - Difficulty initiating and maintaining sleep-Insomnia. Latency to sleep onset 87 minutes and awake after sleep onset 130 minutes. - Insufficient early sleep and events to meet protocol requirements for split CPAP titration protocol on this study. - No significant central sleep apnea occurred during this study (CAI = 0.0/h). - Mild oxygen desaturation was noted during this study (Min O2 = 87.00%). - The patient snored with Loud snoring volume. - No cardiac abnormalities were noted during this study. - Mild periodic limb movements of sleep occurred during the study. Associated arousals were significant.  DIAGNOSIS - Obstructive Sleep Apnea (327.23 [G47.33 ICD-10]) - Insomnia  RECOMMENDATIONS - Very mild obstructive sleep apnea. Return to provider to discuss treatment options. - Avoid alcohol, sedatives and other CNS depressants that may worsen sleep apnea and disrupt normal sleep architecture. - Sleep hygiene should be reviewed to assess factors that may improve sleep quality. - Weight management and regular exercise should be initiated or continued if appropriate.  Deneise Lever Diplomate, American Board of Sleep Medicine  ELECTRONICALLY SIGNED ON:  08/06/2015, 1:07 PM [place] PH: [phone]   FX: [phone] [place]

## 2015-08-18 ENCOUNTER — Ambulatory Visit (INDEPENDENT_AMBULATORY_CARE_PROVIDER_SITE_OTHER): Payer: Medicare Other | Admitting: Family Medicine

## 2015-08-18 VITALS — BP 124/62 | HR 67 | Temp 98.4°F | Resp 18 | Ht 64.0 in | Wt 198.0 lb

## 2015-08-18 DIAGNOSIS — S61401A Unspecified open wound of right hand, initial encounter: Secondary | ICD-10-CM | POA: Diagnosis not present

## 2015-08-18 DIAGNOSIS — Z23 Encounter for immunization: Secondary | ICD-10-CM

## 2015-08-18 DIAGNOSIS — W540XXA Bitten by dog, initial encounter: Secondary | ICD-10-CM

## 2015-08-18 DIAGNOSIS — M79641 Pain in right hand: Secondary | ICD-10-CM

## 2015-08-18 MED ORDER — DOXYCYCLINE HYCLATE 100 MG PO CAPS: 100.0000 mg | ORAL_CAPSULE | Freq: Two times a day (BID) | ORAL | Status: DC

## 2015-08-18 NOTE — Progress Notes (Signed)
Risk and benefits discussed and verbal consent obtained. Anesthetic allergies reviewed. Patient anesthetized using 1:1 mix of 2% lidocaine with epi and Marcaine. The wound was cleansed thoroughly with soap and water. Sterile prep and drape. Wound closed with 7 throws using 4-0 Ethilon suture material. Hemostasis achieved. Mupirocin applied to the wound and bandage placed. The patient tolerated well. Wound instructions were provided and the patient is to return in 10 days for suture removal.

## 2015-08-18 NOTE — Progress Notes (Signed)
   Subjective:    Patient ID: April [name], female    DOB: 23-May-1951, 64 y.o.   MRN: [id number] By signing my name below, I, Zola Button, attest that this documentation has been prepared under the direction and in the presence of Robyn Haber, MD.  Electronically Signed: Zola Button, Medical Scribe. 08/18/2015. 5:41 PM.  HPI HPI Comments: April [name] is a 64 y.o. female who presents to the [facility] complaining of dog bite to her right hand that occurred earlier today around 1:45 PM. Patient states she was petting a neighbor's dog when the dog bit her. Her neighbor has told her that the dog has had all the shots. She is unsure of her tetanus status.  PCP: Dr. Deirdre Pippins  Review of Systems  Skin: Positive for wound.       Objective:   Physical Exam CONSTITUTIONAL: Well developed/well nourished HEAD: Normocephalic/atraumatic EYES: EOM/PERRL ENMT: Mucous membranes moist NECK: supple no meningeal signs SPINE: entire spine nontender CV: S1/S2 noted, no murmurs/rubs/gallops noted LUNGS: Lungs are clear to auscultation bilaterally, no apparent distress ABDOMEN: soft, nontender, no rebound or guarding GU: no cva tenderness NEURO: Pt is awake/alert, moves all extremitiesx4 EXTREMITIES: pulses normal, full ROM SKIN: warm, color normal; dorsal right hand has a 1-2 cm laceration which is full-thickness in the web space between her fourth and fifth fingers. Range of motion is normal, sensation is normal, and the wound is dry with no active bleeding. PSYCH: no abnormalities of mood noted      Assessment & Plan:   This chart was scribed in my presence and reviewed by me personally.    ICD-9-CM ICD-10-CM   1. Dog bite 879.8 T14.8    E906.0 W54.0XXA   2. Pain of right hand 729.5 M79.641      Signed, Robyn Haber, MD

## 2015-08-18 NOTE — Patient Instructions (Signed)

## 2015-08-27 ENCOUNTER — Ambulatory Visit (INDEPENDENT_AMBULATORY_CARE_PROVIDER_SITE_OTHER): Payer: Medicare Other | Admitting: Physician Assistant

## 2015-08-27 VITALS — BP 120/66 | HR 63 | Temp 97.6°F | Resp 18 | Ht 64.0 in | Wt 196.8 lb

## 2015-08-27 DIAGNOSIS — Z23 Encounter for immunization: Secondary | ICD-10-CM

## 2015-08-27 DIAGNOSIS — S61451D Open bite of right hand, subsequent encounter: Secondary | ICD-10-CM

## 2015-08-27 DIAGNOSIS — Z4802 Encounter for removal of sutures: Secondary | ICD-10-CM

## 2015-08-27 DIAGNOSIS — W540XXD Bitten by dog, subsequent encounter: Secondary | ICD-10-CM

## 2015-08-27 NOTE — Progress Notes (Signed)
April [name]  MRN: [id number] DOB: Jan 13, 1951  Subjective:  Pt presents to clinic for suture removal from a dog bite on her right hand 9 days ago.  The area is starting to bother her again and feel tight.  She would also like her pneumonia vaccine.  Patient Active Problem List   Diagnosis Date Noted  . Special screening for malignant neoplasms, colon 08/25/2012  . Hypothyroid 08/24/2011    Current Outpatient Prescriptions on File Prior to Visit  Medication Sig Dispense Refill  . Ascorbic Acid (VITAMIN C) 1000 MG tablet Take 1,000 mg by mouth daily.      Marland Kitchen aspirin 81 MG tablet Take 81 mg by mouth daily. Only when traveling    . BETAINE PO Take 7 tablets by mouth 3 (three) times daily with meals.     . Cholecalciferol (VITAMIN D3) 5000 UNITS CAPS Take 1 tablet by mouth daily.     . Coenzyme Q10 (COQ10 PO) Take 1 capsule by mouth daily.    Marland Kitchen DHEA 50 MG CAPS Take by mouth daily.    Marland Kitchen doxycycline (VIBRAMYCIN) 100 MG capsule Take 1 capsule (100 mg total) by mouth 2 (two) times daily. 14 capsule 0  . Iodine Strong, Lugols, (IODINE STRONG PO) Take 1 capsule by mouth 3 (three) times daily.     Marland Kitchen liothyronine (CYTOMEL) 5 MCG tablet 2 tablets every morning and 3 tablet in the afternoon.    . Magnesium (MAGNESIUM GLYCINATE PLUS) 110 MG CAPS Take 2 capsules by mouth at bedtime.     . metFORMIN (GLUCOPHAGE) 500 MG tablet Take by mouth 1 day or 1 dose.    . NON FORMULARY Take 2 capsules by mouth 3 (three) times daily with meals. Ultra nutrient supplement    . OVER THE COUNTER MEDICATION Take 3 tablets by mouth daily. Mix RX supplement with each meal    . pravastatin (PRAVACHOL) 40 MG tablet Take 40 mg by mouth every evening.     . thyroid (ARMOUR) 60 MG tablet Take 90 mg by mouth daily before breakfast.     . vitamin A 25000 UNIT capsule Take 25,000 Units by mouth daily.      No current facility-administered medications on file prior to visit.    No Known Allergies  Review of Systems    Constitutional: Negative for fever and chills.   Objective:  BP 120/66 mmHg  Pulse 63  Temp(Src) 97.6 F (36.4 C) (Oral)  Resp 18  Ht 5\' 4"  (1.626 m)  Wt 196 lb 12.8 oz (89.268 kg)  BMI 33.76 kg/m2  SpO2 98%  Physical Exam  Constitutional: She is oriented to person, place, and time and well-developed, well-nourished, and in no distress.  HENT:  Head: Normocephalic and atraumatic.  Right Ear: Hearing and external ear normal.  Left Ear: Hearing and external ear normal.  Eyes: Conjunctivae are normal.  Neck: Normal range of motion.  Pulmonary/Chest: Effort normal.  Neurological: She is alert and oriented to person, place, and time. Gait normal.  Skin: Skin is warm and dry.  Healed wound with erythema only at the wound edge - scab is formed.  The wound is partially healed over the stitches.  Sutures are removed and there is slight bleeding after the sutures are removed but good wound healing.  Psychiatric: Mood, memory, affect and judgment normal.  Vitals reviewed.   Assessment and Plan :  Need for vaccination with 13-polyvalent pneumococcal conjugate vaccine - Plan: Pneumococcal conjugate vaccine 13-valent IM, Care order/instruction  Encounter for removal of sutures - Plan: Care order/instruction  Dog bite of right hand, subsequent encounter wound care d/w pt -  Windell Hummingbird PA-C  [facility] 08/27/2015 8:54 AM

## 2015-10-11 ENCOUNTER — Ambulatory Visit (HOSPITAL_BASED_OUTPATIENT_CLINIC_OR_DEPARTMENT_OTHER): Payer: Medicare Other

## 2015-11-02 ENCOUNTER — Other Ambulatory Visit: Payer: Self-pay | Admitting: Cardiothoracic Surgery

## 2015-11-02 DIAGNOSIS — J9859 Other diseases of mediastinum, not elsewhere classified: Secondary | ICD-10-CM

## 2015-11-03 ENCOUNTER — Ambulatory Visit
Admission: RE | Admit: 2015-11-03 | Discharge: 2015-11-03 | Disposition: A | Discharge status: Home / Self Care | Payer: Medicare Other | Source: Ambulatory Visit | Attending: Cardiothoracic Surgery | Admitting: Cardiothoracic Surgery

## 2015-11-03 ENCOUNTER — Ambulatory Visit (INDEPENDENT_AMBULATORY_CARE_PROVIDER_SITE_OTHER): Payer: Medicare Other | Admitting: Cardiothoracic Surgery

## 2015-11-03 ENCOUNTER — Encounter: Payer: Self-pay | Admitting: Cardiothoracic Surgery

## 2015-11-03 VITALS — BP 121/78 | HR 74 | Resp 16 | Ht 64.0 in | Wt 195.0 lb

## 2015-11-03 DIAGNOSIS — Z9089 Acquired absence of other organs: Secondary | ICD-10-CM

## 2015-11-03 DIAGNOSIS — D150 Benign neoplasm of thymus: Secondary | ICD-10-CM

## 2015-11-03 DIAGNOSIS — J9859 Other diseases of mediastinum, not elsewhere classified: Secondary | ICD-10-CM

## 2015-11-03 DIAGNOSIS — Z09 Encounter for follow-up examination after completed treatment for conditions other than malignant neoplasm: Secondary | ICD-10-CM | POA: Diagnosis not present

## 2015-11-03 NOTE — Progress Notes (Signed)
[place] [facility] [id number] Date of Birth: Aug 16, 1951  Josue Hector, MD Lollie Sails, MD  Chief Complaint:   PostOp Follow Up Visit 10/31/2011  PREOPERATIVE DIAGNOSIS: Anterior mediastinal mass consistent with  thymoma.  POSTOPERATIVE DIAGNOSIS: Anterior mediastinal mass consistent with  thymoma.  SURGICAL PROCEDURE: Median sternotomy with resection of anterior  mediastinal tumor, probable well encapsulated thymoma.   History of Present Illness:      Patient's doing well now 4 years  following resection of an encapsulated benign-appearing thyoma. She denies any chest discomfort has returned to full activities. She denies any chest pain. Denies any new medical problems over the past year.   History  Smoking status  . Former Smoker -- 1.00 packs/day for 17 years  . Types: Cigarettes  . Quit date: 09/26/1983  Smokeless tobacco  . Never Used       No Known Allergies  Current Outpatient Prescriptions  Medication Sig Dispense Refill  . Ascorbic Acid (VITAMIN C) 1000 MG tablet Take 1,000 mg by mouth daily.      Marland Kitchen aspirin 81 MG tablet Take 81 mg by mouth daily. Only when traveling    . BETAINE PO Take 7 tablets by mouth 3 (three) times daily with meals.     . Cholecalciferol (VITAMIN D3) 5000 UNITS CAPS Take 1 tablet by mouth daily.     . Coenzyme Q10 (COQ10 PO) Take 1 capsule by mouth daily.    Marland Kitchen DHEA 50 MG CAPS Take by mouth daily.    . Iodine Strong, Lugols, (IODINE STRONG PO) Take 1 capsule by mouth 3 (three) times daily.     Marland Kitchen liothyronine (CYTOMEL) 5 MCG tablet 25 mcg daily.     . Magnesium (MAGNESIUM GLYCINATE PLUS) 110 MG CAPS Take 1 capsule by mouth at bedtime.     . metFORMIN (GLUCOPHAGE) 500 MG tablet Take by mouth 2 (two) times daily with a meal.     . NON FORMULARY Take 2 capsules by mouth 3 (three) times daily with meals. Ultra nutrient supplement    . OVER THE COUNTER MEDICATION Take 3 tablets by mouth daily. Mix RX  supplement with each meal    . pravastatin (PRAVACHOL) 40 MG tablet Take 40 mg by mouth every evening.     . thyroid (ARMOUR) 60 MG tablet Take 120 mg by mouth every other day.     . vitamin A 25000 UNIT capsule Take 25,000 Units by mouth daily.      No current facility-administered medications for this visit.       Physical Exam: BP 121/78 mmHg  Pulse 74  Resp 16  Ht 5\' 4"  (1.626 m)  Wt 195 lb (88.451 kg)  BMI 33.46 kg/m2  SpO2 98%  General appearance: alert and cooperative Neurologic: intact Heart: regular rate and rhythm, S1, S2 normal, no murmur, click, rub or gallop and normal apical impulse Lungs: clear to auscultation bilaterally and normal percussion bilaterally Abdomen: soft, non-tender; bowel sounds normal; no masses,  no organomegaly Extremities: extremities normal, atraumatic, no cyanosis or edema and Homans sign is negative, no sign of DVT Wound: Sternum is stable and well healed There is no cervical supraclavicular axillary adenopathy  Diagnostic Studies & Laboratory data:         Recent Radiology Findings: Dg Chest 2 View  11/03/2015  CLINICAL DATA:  History of media stone item E previously for resection of thymoma in 2013, followup EXAM: CHEST  2 VIEW COMPARISON:  Chest x-ray of 10/28/2014 10/08/2013 and CT chest of 10/09/2012 FINDINGS: No active infiltrate or effusion is seen. Median sternotomy sutures are noted from prior resection of thymoma. No acute bony abnormality is seen. Opacity overlying the anterior left first costochondral junction is unchanged and consistent with degenerative change when compared to the prior CT of the chest. IMPRESSION: No active cardiopulmonary disease. Electronically Signed   By: Ivar Drape M.D.   On: 11/03/2015 11:18    I have independently reviewed the above radiology studies  and reviewed the findings with the patient.  Recent Labs: Lab Results  Component Value Date   WBC 11.2* 11/12/2013   HGB 13.6 11/12/2013   HCT 42.3  11/12/2013   PLT 189 11/02/2011   GLUCOSE 107* 11/02/2011   ALT 23 10/29/2011   AST 25 10/29/2011   NA 136 11/02/2011   K 3.8 11/02/2011   CL 102 11/02/2011   CREATININE 0.58 11/02/2011   BUN 10 11/02/2011   CO2 27 11/02/2011   INR 1.02 10/29/2011   Patient does not get flu shots and has not had one this year, she has had colonoscopy,    Assessment / Plan:   Good progress following excision of large anterior mediastinal mass 4 years ago- which was a benign thymoma appearing at the time of resection No evidence of recurrence on CT scan last year. Patient is concerned about radiation exposure and did not wish to have a followup CT scan.  Chest x-ray done today is clear We will plan the see her in followup in one year with a PA and lateral chest x-ray.   Grace Isaac 11/03/2015 12:51 PM

## 2016-07-26 DIAGNOSIS — E559 Vitamin D deficiency, unspecified: Secondary | ICD-10-CM | POA: Diagnosis not present

## 2016-07-26 DIAGNOSIS — E7210 Disorders of sulfur-bearing amino-acid metabolism, unspecified: Secondary | ICD-10-CM | POA: Diagnosis not present

## 2016-07-26 DIAGNOSIS — E039 Hypothyroidism, unspecified: Secondary | ICD-10-CM | POA: Diagnosis not present

## 2016-07-26 DIAGNOSIS — E279 Disorder of adrenal gland, unspecified: Secondary | ICD-10-CM | POA: Diagnosis not present

## 2016-07-26 DIAGNOSIS — E7800 Pure hypercholesterolemia, unspecified: Secondary | ICD-10-CM | POA: Diagnosis not present

## 2016-07-26 DIAGNOSIS — R739 Hyperglycemia, unspecified: Secondary | ICD-10-CM | POA: Diagnosis not present

## 2016-07-26 DIAGNOSIS — D508 Other iron deficiency anemias: Secondary | ICD-10-CM | POA: Diagnosis not present

## 2016-07-26 DIAGNOSIS — E509 Vitamin A deficiency, unspecified: Secondary | ICD-10-CM | POA: Diagnosis not present

## 2016-07-26 DIAGNOSIS — E612 Magnesium deficiency: Secondary | ICD-10-CM | POA: Diagnosis not present

## 2016-07-27 ENCOUNTER — Other Ambulatory Visit (HOSPITAL_COMMUNITY): Payer: Self-pay | Admitting: Internal Medicine

## 2016-07-27 DIAGNOSIS — N951 Menopausal and female climacteric states: Secondary | ICD-10-CM

## 2016-08-07 ENCOUNTER — Other Ambulatory Visit (HOSPITAL_COMMUNITY): Payer: Self-pay | Admitting: Internal Medicine

## 2016-08-07 DIAGNOSIS — N951 Menopausal and female climacteric states: Secondary | ICD-10-CM

## 2016-08-08 ENCOUNTER — Other Ambulatory Visit (HOSPITAL_COMMUNITY): Payer: Self-pay | Admitting: Internal Medicine

## 2016-08-08 ENCOUNTER — Ambulatory Visit
Admission: RE | Admit: 2016-08-08 | Discharge: 2016-08-08 | Disposition: A | Discharge status: Home / Self Care | Payer: Medicare Other | Source: Ambulatory Visit | Attending: Internal Medicine | Admitting: Internal Medicine

## 2016-08-08 DIAGNOSIS — M8580 Other specified disorders of bone density and structure, unspecified site: Secondary | ICD-10-CM

## 2016-08-08 DIAGNOSIS — N951 Menopausal and female climacteric states: Secondary | ICD-10-CM

## 2016-08-08 DIAGNOSIS — M8588 Other specified disorders of bone density and structure, other site: Secondary | ICD-10-CM | POA: Diagnosis not present

## 2016-08-08 DIAGNOSIS — Z780 Asymptomatic menopausal state: Secondary | ICD-10-CM | POA: Diagnosis not present

## 2016-08-10 DIAGNOSIS — H2513 Age-related nuclear cataract, bilateral: Secondary | ICD-10-CM | POA: Diagnosis not present

## 2016-08-10 DIAGNOSIS — H524 Presbyopia: Secondary | ICD-10-CM | POA: Diagnosis not present

## 2016-08-10 DIAGNOSIS — H43813 Vitreous degeneration, bilateral: Secondary | ICD-10-CM | POA: Diagnosis not present

## 2016-08-10 DIAGNOSIS — H40013 Open angle with borderline findings, low risk, bilateral: Secondary | ICD-10-CM | POA: Diagnosis not present

## 2016-09-05 ENCOUNTER — Ambulatory Visit (INDEPENDENT_AMBULATORY_CARE_PROVIDER_SITE_OTHER): Payer: Medicare Other | Admitting: Physician Assistant

## 2016-09-05 VITALS — BP 102/70 | HR 77 | Temp 97.9°F | Resp 18 | Ht 64.0 in | Wt 198.0 lb

## 2016-09-05 DIAGNOSIS — H6123 Impacted cerumen, bilateral: Secondary | ICD-10-CM | POA: Diagnosis not present

## 2016-09-05 DIAGNOSIS — Z23 Encounter for immunization: Secondary | ICD-10-CM | POA: Diagnosis not present

## 2016-09-05 NOTE — Patient Instructions (Signed)
Please do not use Q-tips, as this will further impact the ear wax in your ear.   If you have cerumen impaction more than once a year you can reduce the occurrences by using a cotton ball dipped in mineral oil and place it in the external canal for 10-20 minutes once a week (combined with eight hours of not using a hearing aid overnight, if applicable). This helps liquify cerumen and aid in the normal elimination mechanisms.    Routine cleaning of ears by a health professional every 6-12 months is recommended.   If you have itchy ears, use sweet oil. If you need more relief use a small amount of hydrocortisone ointment.   Thank you for coming in today. I hope you feel we met your needs.  Feel free to call [facility] if you have any questions or further requests.  Please consider signing up for MyChart if you do not already have it, as this is a great way to communicate with me.  Best,  [employer], PA-C

## 2016-09-05 NOTE — Progress Notes (Signed)
April [name]  MRN: [id number] DOB: 03-08-1951  PCP: Lollie Sails, MD  Subjective:  Pt is a 65 year old female presents to clinic for ear wax build-up.  She notes decreased hearing R>L and a feeling of "ear fullness" This happens to her about three times a year. She has to have her ears cleaned out. She tries to use ear drops at home, however is not consistent.  Denies discharge, ear pain, disequilibrium, fever, chills.   Review of Systems  Constitutional: Negative for chills, diaphoresis, fatigue and fever.  HENT: Positive for hearing loss. Negative for congestion, ear discharge, ear pain, postnasal drip, rhinorrhea, sinus pressure, sneezing and sore throat.   Respiratory: Negative for cough, chest tightness, shortness of breath and wheezing.   Cardiovascular: Negative for chest pain and palpitations.  Gastrointestinal: Negative for abdominal pain, diarrhea, nausea and vomiting.  Neurological: Negative for weakness, light-headedness and headaches.    Patient Active Problem List   Diagnosis Date Noted  . Special screening for malignant neoplasms, colon 08/25/2012  . Hypothyroid 08/24/2011    Current Outpatient Prescriptions on File Prior to Visit  Medication Sig Dispense Refill  . Ascorbic Acid (VITAMIN C) 1000 MG tablet Take 1,000 mg by mouth daily.      Marland Kitchen aspirin 81 MG tablet Take 81 mg by mouth daily. Only when traveling    . BETAINE PO Take 7 tablets by mouth 3 (three) times daily with meals.     . Cholecalciferol (VITAMIN D3) 5000 UNITS CAPS Take 1 tablet by mouth daily.     . Coenzyme Q10 (COQ10 PO) Take 1 capsule by mouth daily.    . Iodine Strong, Lugols, (IODINE STRONG PO) Take 1 capsule by mouth 3 (three) times daily.     Marland Kitchen liothyronine (CYTOMEL) 5 MCG tablet 25 mcg daily.     . Magnesium (MAGNESIUM GLYCINATE PLUS) 110 MG CAPS Take 1 capsule by mouth at bedtime.     . metFORMIN (GLUCOPHAGE) 500 MG tablet Take by mouth 2 (two) times daily with a meal.     . NON  FORMULARY Take 2 capsules by mouth 3 (three) times daily with meals. Ultra nutrient supplement    . OVER THE COUNTER MEDICATION Take 3 tablets by mouth daily. Mix RX supplement with each meal    . pravastatin (PRAVACHOL) 40 MG tablet Take 40 mg by mouth every evening.     . thyroid (ARMOUR) 60 MG tablet Take 120 mg by mouth every other day.     . vitamin A 25000 UNIT capsule Take 25,000 Units by mouth daily.      No current facility-administered medications on file prior to visit.     No Known Allergies   Objective:  BP 102/70 (BP Location: Right Arm, Patient Position: Sitting, Cuff Size: Large)   Pulse 77   Temp 97.9 F (36.6 C) (Oral)   Resp 18   Ht 5\' 4"  (1.626 m)   Wt 198 lb (89.8 kg)   SpO2 96%   BMI 33.99 kg/m   Physical Exam  Constitutional: She is oriented to person, place, and time and well-developed, well-nourished, and in no distress. No distress.  HENT:  Right Ear: Tympanic membrane and ear canal normal.  Left Ear: Tympanic membrane and ear canal normal.  Cardiovascular: Normal rate, regular rhythm and normal heart sounds.   Neurological: She is alert and oriented to person, place, and time. GCS score is 15.  Skin: Skin is warm and dry.  Psychiatric: Mood, memory, affect  and judgment normal.  Vitals reviewed.   Assessment and Plan :  1. Bilateral impacted cerumen - Ear wax removal - TM visualized after ear lavage. Information regarding home care of ears printed off for patient. RTC if symptoms return/worsen.   2. Need for prophylactic vaccination and inoculation against influenza - Flu Vaccine QUAD 36+ mos IM  3. Need for pneumococcal vaccination - Pneumococcal polysaccharide vaccine 23-valent greater than or equal to 2yo subcutaneous/IM   Mercer Pod, PA-C  [facility] 09/05/2016 2:33 PM

## 2016-10-16 ENCOUNTER — Ambulatory Visit (INDEPENDENT_AMBULATORY_CARE_PROVIDER_SITE_OTHER): Payer: Medicare Other | Admitting: Family Medicine

## 2016-10-16 VITALS — BP 118/76 | HR 86 | Temp 98.9°F | Resp 18 | Ht 64.0 in | Wt 189.0 lb

## 2016-10-16 DIAGNOSIS — J069 Acute upper respiratory infection, unspecified: Secondary | ICD-10-CM

## 2016-10-16 MED ORDER — HYDROCOD POLST-CPM POLST ER 10-8 MG/5ML PO SUER: 5.0000 mL | Freq: Two times a day (BID) | ORAL | 0 refills | Status: DC | PRN

## 2016-10-16 NOTE — Progress Notes (Signed)
Patient ID: April [name], female    DOB: [birth year], 66 y.o.   MRN: [id number]  PCP: Lollie Sails, MD  Chief Complaint  Patient presents with  . Nasal Congestion  . Wheezing    Subjective:  HPI 66 year old female presents for evaluation of nasal congestions and wheezing x 2 days Congestion in chest yesterday, some wheezing x 1 day, persistent cough. Reports sensation of chest tightness. Denies headache, ear pain, nausea, chills, or fever. No hx of chronic bronchitis or asthma.  Review of Systems See HPI  Patient Active Problem List   Diagnosis Date Noted  . Special screening for malignant neoplasms, colon 08/25/2012  . Hypothyroid 08/24/2011    No Known Allergies  Prior to Admission medications   Medication Sig Start Date End Date Taking? Authorizing Provider  Ascorbic Acid (VITAMIN C) 1000 MG tablet Take 1,000 mg by mouth daily.     Yes Historical Provider, MD  aspirin 81 MG tablet Take 81 mg by mouth daily. Only when traveling   Yes Historical Provider, MD  BETAINE PO Take 7 tablets by mouth 3 (three) times daily with meals.    Yes Historical Provider, MD  Cholecalciferol (VITAMIN D3) 5000 UNITS CAPS Take 1 tablet by mouth daily.    Yes Historical Provider, MD  Coenzyme Q10 (COQ10 PO) Take 1 capsule by mouth daily.   Yes Historical Provider, MD  Iodine Strong, Lugols, (IODINE STRONG PO) Take 1 capsule by mouth 3 (three) times daily.    Yes Historical Provider, MD  liothyronine (CYTOMEL) 5 MCG tablet 25 mcg daily.    Yes Historical Provider, MD  Magnesium (MAGNESIUM GLYCINATE PLUS) 110 MG CAPS Take 2 capsules by mouth at bedtime.    Yes Historical Provider, MD  metFORMIN (GLUCOPHAGE) 500 MG tablet Take by mouth 2 (two) times daily with a meal.    Yes Historical Provider, MD  NON FORMULARY Take 2 capsules by mouth 3 (three) times daily with meals. Ultra nutrient supplement   Yes Historical Provider, MD  OVER THE COUNTER MEDICATION Take 3 tablets by mouth daily. Mix RX  supplement with each meal   Yes Historical Provider, MD  pravastatin (PRAVACHOL) 40 MG tablet Take 40 mg by mouth every evening.    Yes Historical Provider, MD  thyroid (ARMOUR) 60 MG tablet Take 120 mg by mouth every other day.    Yes Historical Provider, MD  vitamin A 25000 UNIT capsule Take 25,000 Units by mouth daily.    Yes Historical Provider, MD    Past Medical, Surgical Family and Social History reviewed and updated.    Objective:   Today's Vitals   10/16/16 1506  BP: 118/76  Pulse: 86  Resp: 18  Temp: 98.9 F (37.2 C)  TempSrc: Oral  SpO2: 95%  Weight: 189 lb (85.7 kg)  Height: 5\' 4"  (1.626 m)    Wt Readings from Last 3 Encounters:  10/16/16 189 lb (85.7 kg)  09/05/16 198 lb (89.8 kg)  11/03/15 195 lb (88.5 kg)   Physical Exam  Constitutional: She is oriented to person, place, and time. She appears well-developed and well-nourished.  HENT:  Head: Normocephalic and atraumatic.  Right Ear: External ear normal.  Left Ear: External ear normal.  Nose: Nose normal.  Mouth/Throat: Oropharynx is clear and moist.  Eyes: Conjunctivae are normal. Pupils are equal, round, and reactive to light.  Neck: Normal range of motion.  Cardiovascular: Normal rate, regular rhythm, normal heart sounds and intact distal pulses.   Pulmonary/Chest: Effort  normal and breath sounds normal.  Musculoskeletal: Normal range of motion.  Lymphadenopathy:    She has no cervical adenopathy.  Neurological: She is alert and oriented to person, place, and time.  Skin: Skin is warm and dry.  Psychiatric: She has a normal mood and affect. Her behavior is normal. Judgment and thought content normal.      Assessment & Plan:  1. Acute upper respiratory infection, which is more than likely due to a viral illness -For cough, chlorpheniramine-hydrocodone (Tussionex) 5 ml every 12 hours. Medication causes drowsiness and or sedation.  -Provided information regarding signs and symptoms of influenza. Pt  instructed to call me if symptoms of influenza develop and I will start her on Tamiflu.  Carroll Sage. Kenton Kingfisher, MSN, FNP-C [facility]

## 2016-10-16 NOTE — Patient Instructions (Addendum)
I have given you a handout about signs and symptoms of influenza. If you began to develop a fever or bodyaches, please call me and advise so I can start you on Tamiflu.  -I am treating your cough with Tussionex take as follows: For cough, chlorpheniramine-hydrocodone (Tussionex) 5 ml every 12 hours. Medication causes drowsiness and or sedation.    IF you received an x-ray today, you will receive an invoice from [facility]. Please contact [facility] at [phone] with questions or concerns regarding your invoice.   IF you received labwork today, you will receive an invoice from [facility]. Please contact [facility] at [phone] with questions or concerns regarding your invoice.   Our billing staff will not be able to assist you with questions regarding bills from these companies.  You will be contacted with the lab results as soon as they are available. The fastest way to get your results is to activate your My Chart account. Instructions are located on the last page of this paperwork. If you have not heard from [place] regarding the results in 2 weeks, please contact this office.     Influenza, Adult Influenza, more commonly known as "the flu," is a viral infection that primarily affects the respiratory tract. The respiratory tract includes organs that help you breathe, such as the lungs, nose, and throat. The flu causes many common cold symptoms, as well as a high fever and body aches. The flu spreads easily from person to person (is contagious). Getting a flu shot (influenza vaccination) every year is the best way to prevent influenza. What are the causes? Influenza is caused by a virus. You can catch the virus by:  Breathing in droplets from an infected person's cough or sneeze.  Touching something that was recently contaminated with the virus and then touching your mouth, nose, or eyes. What increases the risk? The following factors may make you more likely to get the  flu:  Not cleaning your hands frequently with soap and water or alcohol-based hand sanitizer.  Having close contact with many people during cold and flu season.  Touching your mouth, eyes, or nose without washing or sanitizing your hands first.  Not drinking enough fluids or not eating a healthy diet.  Not getting enough sleep or exercise.  Being under a high amount of stress.  Not getting a yearly (annual) flu shot. You may be at a higher risk of complications from the flu, such as a severe lung infection (pneumonia), if you:  Are over the age of 49.  Are pregnant.  Have a weakened disease-fighting system (immune system). You may have a weakened immune system if you:  Have HIV or AIDS.  Are undergoing chemotherapy.  Aretaking medicines that reduce the activity of (suppress) the immune system.  Have a long-term (chronic) illness, such as heart disease, kidney disease, diabetes, or lung disease.  Have a liver disorder.  Are obese.  Have anemia. What are the signs or symptoms? Symptoms of this condition typically last 4-10 days and may include:  Fever.  Chills.  Headache, body aches, or muscle aches.  Sore throat.  Cough.  Runny or congested nose.  Chest discomfort and cough.  Poor appetite.  Weakness or tiredness (fatigue).  Dizziness.  Nausea or vomiting. How is this diagnosed? This condition may be diagnosed based on your medical history and a physical exam. Your health care provider may do a nose or throat swab test to confirm the diagnosis. How is this treated? If influenza is  detected early, you can be treated with antiviral medicine that can reduce the length of your illness and the severity of your symptoms. This medicine may be given by mouth (orally) or through an IV tube that is inserted in one of your veins. The goal of treatment is to relieve symptoms by taking care of yourself at home. This may include taking over-the-counter medicines,  drinking plenty of fluids, and adding humidity to the air in your home. In some cases, influenza goes away on its own. Severe influenza or complications from influenza may be treated in a hospital. Follow these instructions at home:  Take over-the-counter and prescription medicines only as told by your health care provider.  Use a cool mist humidifier to add humidity to the air in your home. This can make breathing easier.  Rest as needed.  Drink enough fluid to keep your urine clear or pale yellow.  Cover your mouth and nose when you cough or sneeze.  Wash your hands with soap and water often, especially after you cough or sneeze. If soap and water are not available, use hand sanitizer.  Stay home from work or school as told by your health care provider. Unless you are visiting your health care provider, try to avoid leaving home until your fever has been gone for 24 hours without the use of medicine.  Keep all follow-up visits as told by your health care provider. This is important. How is this prevented?  Getting an annual flu shot is the best way to avoid getting the flu. You may get the flu shot in late summer, fall, or winter. Ask your health care provider when you should get your flu shot.  Wash your hands often or use hand sanitizer often.  Avoid contact with people who are sick during cold and flu season.  Eat a healthy diet, drink plenty of fluids, get enough sleep, and exercise regularly. Contact a health care provider if:  You develop new symptoms.  You have:  Chest pain.  Diarrhea.  A fever.  Your cough gets worse.  You produce more mucus.  You feel nauseous or you vomit. Get help right away if:  You develop shortness of breath or difficulty breathing.  Your skin or nails turn a bluish color.  You have severe pain or stiffness in your neck.  You develop a sudden headache or sudden pain in your face or ear.  You cannot stop vomiting. This  information is not intended to replace advice given to you by your health care provider. Make sure you discuss any questions you have with your health care provider. Document Released: 08/24/2000 Document Revised: 02/02/2016 Document Reviewed: 06/21/2015 Elsevier Interactive Patient Education  2017 [employer].

## 2016-10-31 ENCOUNTER — Other Ambulatory Visit: Payer: Self-pay | Admitting: Cardiothoracic Surgery

## 2016-10-31 DIAGNOSIS — Z9089 Acquired absence of other organs: Secondary | ICD-10-CM

## 2016-11-01 ENCOUNTER — Encounter: Payer: Self-pay | Admitting: Cardiothoracic Surgery

## 2016-11-01 ENCOUNTER — Ambulatory Visit
Admission: RE | Admit: 2016-11-01 | Discharge: 2016-11-01 | Disposition: A | Discharge status: Home / Self Care | Payer: Medicare Other | Source: Ambulatory Visit | Attending: Cardiothoracic Surgery | Admitting: Cardiothoracic Surgery

## 2016-11-01 ENCOUNTER — Ambulatory Visit (INDEPENDENT_AMBULATORY_CARE_PROVIDER_SITE_OTHER): Payer: Medicare Other | Admitting: Cardiothoracic Surgery

## 2016-11-01 VITALS — BP 108/77 | HR 70 | Resp 20 | Ht 64.0 in | Wt 188.0 lb

## 2016-11-01 DIAGNOSIS — Z9089 Acquired absence of other organs: Secondary | ICD-10-CM

## 2016-11-01 DIAGNOSIS — D150 Benign neoplasm of thymus: Secondary | ICD-10-CM | POA: Diagnosis not present

## 2016-11-01 DIAGNOSIS — R918 Other nonspecific abnormal finding of lung field: Secondary | ICD-10-CM | POA: Diagnosis not present

## 2016-11-01 NOTE — Progress Notes (Signed)
April [name] Date of Birth: Apr 09, 1951  April Hector, MD April Sails, MD  Chief Complaint:   PostOp Follow Up Visit 10/31/2011  PREOPERATIVE DIAGNOSIS: Anterior mediastinal mass consistent with  thymoma.  POSTOPERATIVE DIAGNOSIS: Anterior mediastinal mass consistent with  thymoma.  SURGICAL PROCEDURE: Median sternotomy with resection of anterior  mediastinal tumor, probable well encapsulated thymoma.   History of Present Illness:      Patient's doing well now 5 years  following resection of an encapsulated benign-appearing thyoma. She denies any chest discomfort has returned to full activities. She denies any chest pain. Denies any new medical problems over the past year.   History  Smoking Status  . Former Smoker  . Packs/day: 1.00  . Years: 17.00  . Types: Cigarettes  . Quit date: 09/26/1983  Smokeless Tobacco  . Never Used       No Known Allergies  Current Outpatient Prescriptions  Medication Sig Dispense Refill  . Ascorbic Acid (VITAMIN C) 1000 MG tablet Take 1,000 mg by mouth daily.      Marland Kitchen aspirin 81 MG tablet Take 81 mg by mouth daily. Only when traveling    . BETAINE PO Take 7 tablets by mouth 3 (three) times daily with meals.     . Cholecalciferol (VITAMIN D3) 5000 UNITS CAPS Take 1 tablet by mouth daily.     . Coenzyme Q10 (COQ10 PO) Take 1 capsule by mouth daily.    . Iodine Strong, Lugols, (IODINE STRONG PO) Take 1 capsule by mouth 3 (three) times daily.     Marland Kitchen liothyronine (CYTOMEL) 5 MCG tablet 25 mcg daily.     . Magnesium (MAGNESIUM GLYCINATE PLUS) 110 MG CAPS Take 2 capsules by mouth at bedtime.     . metFORMIN (GLUCOPHAGE) 500 MG tablet Take by mouth 2 (two) times daily with a meal.     . NON FORMULARY Take 2 capsules by mouth 3 (three) times daily with meals. Ultra nutrient supplement    . OVER THE COUNTER MEDICATION Take 3 tablets by mouth daily. Mix RX supplement with each meal    .  pravastatin (PRAVACHOL) 40 MG tablet Take 40 mg by mouth every evening.     . thyroid (ARMOUR) 60 MG tablet Take 120 mg by mouth every other day.     . vitamin A 25000 UNIT capsule Take 25,000 Units by mouth daily.      No current facility-administered medications for this visit.        Physical Exam: BP 108/77   Pulse 70   Resp 20   Ht 5\' 4"  (1.626 m)   Wt 188 lb (85.3 kg)   SpO2 99% Comment: RA  BMI 32.27 kg/m   General appearance: alert and cooperative Neurologic: intact Heart: regular rate and rhythm, S1, S2 normal, no murmur, click, rub or gallop and normal apical impulse Lungs: clear to auscultation bilaterally and normal percussion bilaterally Abdomen: soft, non-tender; bowel sounds normal; no masses,  no organomegaly Extremities: extremities normal, atraumatic, no cyanosis or edema and Homans sign is negative, no sign of DVT Wound: Sternum is stable and well healed, no painful sternal wires  There is no cervical supraclavicular  Or axillary adenopathy  Diagnostic Studies & Laboratory data:         Recent Radiology Findings: Dg Chest 2 View  Result Date: 11/01/2016 CLINICAL DATA:  Status post thymoma resection in February 2013, follow-up study. EXAM: CHEST  2 VIEW  COMPARISON:  Chest x-ray of November 03, 2015 FINDINGS: The lungs are well-expanded and clear. The heart and pulmonary vascularity are normal. The mediastinum is normal in width. The trachea is midline. The upper retrosternal soft tissues remain mildly prominent. The sternal wires are intact. There is no pleural effusion. There is calcification in the wall of the aortic arch. The bony thorax exhibits no acute abnormality. IMPRESSION: No evidence of recurrent thymoma or other mediastinal mass. Stable soft tissue fullness in the upper retrosternal region. Thoracic aortic atherosclerosis. Electronically Signed   By: David  Martinique M.D.   On: 11/01/2016 09:36    I have independently reviewed the above radiology studies   and reviewed the findings with the patient.  Recent Labs: Lab Results  Component Value Date   WBC 11.2 (A) 11/12/2013   HGB 13.6 11/12/2013   HCT 42.3 11/12/2013   PLT 189 11/02/2011   GLUCOSE 107 (H) 11/02/2011   ALT 23 10/29/2011   AST 25 10/29/2011   NA 136 11/02/2011   K 3.8 11/02/2011   CL 102 11/02/2011   CREATININE 0.58 11/02/2011   BUN 10 11/02/2011   CO2 27 11/02/2011   INR 1.02 10/29/2011   Patient does not get flu shots and has not had one this year, she has had colonoscopy,    Assessment / Plan:   Stable 5 years after excision of large anterior mediastinal mass- which was a benign thymoma appearing at the time of resection No evidence of recurrence on CT scan 2014 or chest xray today . Patient is concerned about radiation exposure and did not wish to have a followup CT scan.  Chest x-ray done today is clear We will plan the see her as needed   Grace Isaac 11/01/2016 10:07 AM

## 2016-11-26 ENCOUNTER — Other Ambulatory Visit: Payer: Self-pay | Admitting: Dermatology

## 2016-11-26 DIAGNOSIS — L739 Follicular disorder, unspecified: Secondary | ICD-10-CM | POA: Diagnosis not present

## 2016-11-26 DIAGNOSIS — B078 Other viral warts: Secondary | ICD-10-CM | POA: Diagnosis not present

## 2016-11-26 DIAGNOSIS — D492 Neoplasm of unspecified behavior of bone, soft tissue, and skin: Secondary | ICD-10-CM | POA: Diagnosis not present

## 2016-11-26 DIAGNOSIS — D229 Melanocytic nevi, unspecified: Secondary | ICD-10-CM | POA: Diagnosis not present

## 2017-03-28 DIAGNOSIS — E039 Hypothyroidism, unspecified: Secondary | ICD-10-CM | POA: Diagnosis not present

## 2017-03-28 DIAGNOSIS — E611 Iron deficiency: Secondary | ICD-10-CM | POA: Diagnosis not present

## 2017-03-28 DIAGNOSIS — E559 Vitamin D deficiency, unspecified: Secondary | ICD-10-CM | POA: Diagnosis not present

## 2017-03-28 DIAGNOSIS — E509 Vitamin A deficiency, unspecified: Secondary | ICD-10-CM | POA: Diagnosis not present

## 2017-04-25 ENCOUNTER — Encounter: Payer: Self-pay | Admitting: Physician Assistant

## 2017-04-25 ENCOUNTER — Ambulatory Visit (INDEPENDENT_AMBULATORY_CARE_PROVIDER_SITE_OTHER): Payer: Medicare Other | Admitting: Physician Assistant

## 2017-04-25 VITALS — BP 106/70 | HR 64 | Temp 98.1°F | Resp 16 | Ht 63.75 in | Wt 174.6 lb

## 2017-04-25 DIAGNOSIS — H6123 Impacted cerumen, bilateral: Secondary | ICD-10-CM

## 2017-04-25 NOTE — Progress Notes (Signed)
April [name]  MRN: [id number] DOB: 26-Feb-1951  PCP: Lollie Sails, MD  Subjective:  Pt is a 66 year old female who presents to clinic for ears feeling clogged x 1 day. R>L. This happens to her 2-3 times a year. She tried putting oil in her ear this morning - did not help. Endorses some hearing loss.  Denies balance problems, n/v, fever, chills, ear pain, ear drainage. She endorses chronic ringing in her ears.   Review of Systems  Constitutional: Negative for chills, diaphoresis, fatigue and fever.  HENT: Positive for tinnitus (chronic). Negative for congestion, ear discharge, ear pain, postnasal drip, sinus pain and sinus pressure.   Skin: Negative.   Neurological: Negative for dizziness and headaches.    Patient Active Problem List   Diagnosis Date Noted  . Special screening for malignant neoplasms, colon 08/25/2012  . Hypothyroid 08/24/2011    Current Outpatient Prescriptions on File Prior to Visit  Medication Sig Dispense Refill  . Ascorbic Acid (VITAMIN C) 1000 MG tablet Take 1,000 mg by mouth daily.      Marland Kitchen aspirin 81 MG tablet Take 81 mg by mouth daily. Only when traveling    . BETAINE PO Take 7 tablets by mouth 3 (three) times daily with meals.     . Cholecalciferol (VITAMIN D3) 5000 UNITS CAPS Take 1 tablet by mouth daily.     . Coenzyme Q10 (COQ10 PO) Take 1 capsule by mouth daily.    . Iodine Strong, Lugols, (IODINE STRONG PO) Take 1 capsule by mouth 3 (three) times daily.     Marland Kitchen liothyronine (CYTOMEL) 5 MCG tablet 25 mcg daily.     . Magnesium (MAGNESIUM GLYCINATE PLUS) 110 MG CAPS Take 2 capsules by mouth at bedtime.     . metFORMIN (GLUCOPHAGE) 500 MG tablet Take by mouth 2 (two) times daily with a meal.     . NON FORMULARY Take 2 capsules by mouth 3 (three) times daily with meals. Ultra nutrient supplement    . OVER THE COUNTER MEDICATION Take 3 tablets by mouth daily. Mix RX supplement with each meal    . pravastatin (PRAVACHOL) 40 MG tablet Take 40 mg by mouth  every evening.     . thyroid (ARMOUR) 60 MG tablet Take 120 mg by mouth every other day.     . vitamin A 25000 UNIT capsule Take 25,000 Units by mouth daily.      No current facility-administered medications on file prior to visit.     No Known Allergies   Objective:  BP 106/70   Pulse 64   Temp 98.1 F (36.7 C) (Oral)   Resp 16   Ht 5' 3.75" (1.619 m)   Wt 174 lb 9.6 oz (79.2 kg)   SpO2 95%   BMI 30.21 kg/m   Physical Exam  Constitutional: She is oriented to person, place, and time and well-developed, well-nourished, and in no distress. No distress.  HENT:  Right Ear: No tenderness. No mastoid tenderness.  Left Ear: No tenderness. No mastoid tenderness.  B/l cerumen impaction.   Neurological: She is alert and oriented to person, place, and time. GCS score is 15.  Skin: Skin is warm and dry.  Psychiatric: Mood, memory, affect and judgment normal.  Vitals reviewed.   Assessment and Plan :  1. Bilateral impacted cerumen - Ear wax removal - Canals clear s/p ear lavage. Pt reports relief. Cerumen impaction home care tips discussed and printed out for pt. RTC PRN for ear lavage.  Mercer Pod, PA-C  [facility] [place] 04/25/2017 2:33 PM

## 2017-04-25 NOTE — Patient Instructions (Addendum)
Please do not use Q-tips, as this will further impact the ear wax in your ear.   If you have cerumen impaction more than once a year you can reduce the occurrences by using a cotton ball dipped in mineral oil and place it in the external canal for 10-20 minutes once a week (combined with eight hours of not using a hearing aid overnight, if applicable). This helps liquify cerumen and aid in the normal elimination mechanisms.    Routine cleaning of ears by a health professional every 6-12 months is recommended.   If you have itchy ears, use sweet oil. If you need more relief use a small amount of hydrocortisone ointment.   You can let warm water rinse your ear canals by pulling your cartilage away from your head (this straightens the ear canal) and letting stream of shower wash into your ear.    Thank you for coming in today. I hope you feel we met your needs.  Feel free to call PCP if you have any questions or further requests.  Please consider signing up for MyChart if you do not already have it, as this is a great way to communicate with me.  Best,  Whitney McVey, PA-C  We recommend that you schedule a mammogram for breast cancer screening. Typically, you do not need a referral to do this. Please contact a local imaging center to schedule your mammogram.  [facility] - [phone]  *ask for the Radiology Department [facility] ([place]) - [phone] or [phone]  [facility] - [phone] [place] [phone] [facility] - [phone]  *ask for the [facility] - [phone]  *ask for the Radiology Department [facility] - [phone]  *ask for the [facility] - [phone]     IF you received an x-ray today, you will receive an invoice from [facility]. Please contact [facility] at [phone] with  questions or concerns regarding your invoice.   IF you received labwork today, you will receive an invoice from [facility]. Please contact [facility] at [phone] with questions or concerns regarding your invoice.   Our billing staff will not be able to assist you with questions regarding bills from these companies.  You will be contacted with the lab results as soon as they are available. The fastest way to get your results is to activate your My Chart account. Instructions are located on the last page of this paperwork. If you have not heard from [place] regarding the results in 2 weeks, please contact this office.

## 2017-06-25 ENCOUNTER — Other Ambulatory Visit: Payer: Self-pay | Admitting: Dermatology

## 2017-06-25 DIAGNOSIS — B078 Other viral warts: Secondary | ICD-10-CM | POA: Diagnosis not present

## 2017-06-25 DIAGNOSIS — D492 Neoplasm of unspecified behavior of bone, soft tissue, and skin: Secondary | ICD-10-CM | POA: Diagnosis not present

## 2017-07-10 ENCOUNTER — Ambulatory Visit (INDEPENDENT_AMBULATORY_CARE_PROVIDER_SITE_OTHER): Payer: Medicare Other | Admitting: Emergency Medicine

## 2017-07-10 DIAGNOSIS — Z23 Encounter for immunization: Secondary | ICD-10-CM

## 2017-08-13 DIAGNOSIS — H2513 Age-related nuclear cataract, bilateral: Secondary | ICD-10-CM | POA: Diagnosis not present

## 2017-08-13 DIAGNOSIS — H40013 Open angle with borderline findings, low risk, bilateral: Secondary | ICD-10-CM | POA: Diagnosis not present

## 2017-08-13 DIAGNOSIS — H5213 Myopia, bilateral: Secondary | ICD-10-CM | POA: Diagnosis not present

## 2017-08-15 DIAGNOSIS — D509 Iron deficiency anemia, unspecified: Secondary | ICD-10-CM | POA: Diagnosis not present

## 2017-08-15 DIAGNOSIS — E559 Vitamin D deficiency, unspecified: Secondary | ICD-10-CM | POA: Diagnosis not present

## 2017-08-15 DIAGNOSIS — E039 Hypothyroidism, unspecified: Secondary | ICD-10-CM | POA: Diagnosis not present

## 2017-09-06 ENCOUNTER — Encounter: Payer: Self-pay | Admitting: Gastroenterology

## 2017-09-13 DIAGNOSIS — Z124 Encounter for screening for malignant neoplasm of cervix: Secondary | ICD-10-CM | POA: Diagnosis not present

## 2017-10-03 ENCOUNTER — Encounter: Payer: Self-pay | Admitting: Gastroenterology

## 2017-10-11 ENCOUNTER — Telehealth: Payer: Self-pay | Admitting: *Deleted

## 2017-10-11 NOTE — Telephone Encounter (Signed)
Patient is for recall colon with Dr.Stark on 11/08/17. Her last colonoscopy was 2013 at [facility] due to hx of "difficult intubation" , per her chart she states " it took 3 attempts to get tube down-due to small esophagus". Also per T.E. 06-09-12 From Osvaldo Angst he recommended last colon be done at hospital. [facility] for direct colon at [facility] or [place] first? Please advise. Thank you, Robbin Myers,RN

## 2017-10-14 ENCOUNTER — Other Ambulatory Visit: Payer: Self-pay

## 2017-10-14 ENCOUNTER — Encounter: Payer: Self-pay | Admitting: Gastroenterology

## 2017-10-14 DIAGNOSIS — Z86010 Personal history of colonic polyps: Secondary | ICD-10-CM

## 2017-10-14 NOTE — Telephone Encounter (Signed)
Patient has been scheduled for [facility] on 11/25/17 she will need to arrive at 10:00 am for 11:30 procedure Ambulatory referral placed and [facility] procedure cancelled.

## 2017-10-14 NOTE — Telephone Encounter (Signed)
OK for direct colon at [facility]

## 2017-10-14 NOTE — Telephone Encounter (Signed)
Sheri, please schedule colon at [facility]. Thank you! Sharnice Bosler pv

## 2017-10-14 NOTE — Telephone Encounter (Signed)
Patient was notified of new appointment date at [facility] and new pv made for March, closer to colon date.

## 2017-10-22 DIAGNOSIS — E785 Hyperlipidemia, unspecified: Secondary | ICD-10-CM | POA: Diagnosis not present

## 2017-10-22 DIAGNOSIS — B3789 Other sites of candidiasis: Secondary | ICD-10-CM | POA: Diagnosis not present

## 2017-10-22 DIAGNOSIS — Z9229 Personal history of other drug therapy: Secondary | ICD-10-CM | POA: Diagnosis not present

## 2017-10-22 DIAGNOSIS — D649 Anemia, unspecified: Secondary | ICD-10-CM | POA: Diagnosis not present

## 2017-10-22 DIAGNOSIS — H259 Unspecified age-related cataract: Secondary | ICD-10-CM | POA: Diagnosis not present

## 2017-10-22 DIAGNOSIS — E509 Vitamin A deficiency, unspecified: Secondary | ICD-10-CM | POA: Diagnosis not present

## 2017-10-22 DIAGNOSIS — E039 Hypothyroidism, unspecified: Secondary | ICD-10-CM | POA: Diagnosis not present

## 2017-10-22 DIAGNOSIS — B3782 Candidal enteritis: Secondary | ICD-10-CM | POA: Diagnosis not present

## 2017-10-22 DIAGNOSIS — E8881 Metabolic syndrome: Secondary | ICD-10-CM | POA: Diagnosis not present

## 2017-10-22 DIAGNOSIS — E559 Vitamin D deficiency, unspecified: Secondary | ICD-10-CM | POA: Diagnosis not present

## 2017-10-22 DIAGNOSIS — E781 Pure hyperglyceridemia: Secondary | ICD-10-CM | POA: Diagnosis not present

## 2017-10-22 DIAGNOSIS — E063 Autoimmune thyroiditis: Secondary | ICD-10-CM | POA: Diagnosis not present

## 2017-10-24 DIAGNOSIS — Z1231 Encounter for screening mammogram for malignant neoplasm of breast: Secondary | ICD-10-CM | POA: Diagnosis not present

## 2017-11-08 ENCOUNTER — Encounter: Payer: Medicare Other | Admitting: Gastroenterology

## 2017-11-11 ENCOUNTER — Other Ambulatory Visit: Payer: Self-pay

## 2017-11-11 ENCOUNTER — Telehealth: Payer: Self-pay | Admitting: Gastroenterology

## 2017-11-11 ENCOUNTER — Ambulatory Visit (AMBULATORY_SURGERY_CENTER): Payer: Self-pay

## 2017-11-11 VITALS — Ht 63.0 in | Wt 170.6 lb

## 2017-11-11 DIAGNOSIS — Z86010 Personal history of colonic polyps: Secondary | ICD-10-CM

## 2017-11-11 MED ORDER — NA SULFATE-K SULFATE-MG SULF 17.5-3.13-1.6 GM/177ML PO SOLN: 1.0000 | Freq: Once | ORAL | 0 refills | Status: AC

## 2017-11-11 NOTE — Progress Notes (Signed)
No egg or soy allergy known to patient  No issues with past sedation with any surgeries  or procedures, no intubation problems  No diet pills per patient No home 02 use per patient  No blood thinners per patient  Pt denies issues with constipation  No A fib or A flutter  EMMI video sent to pt's e mail sent during previsit

## 2017-11-11 NOTE — Telephone Encounter (Signed)
The pt has been advised that April [name] will contact her tomorrow to reschedule the hospital procedure.  She will keep the previsit appt for this afternoon

## 2017-11-11 NOTE — Progress Notes (Signed)
Pt is hospital case and needs to reschedule due to going out of town for a family emergency. Pt will need a new previsit and a hospital colon schedule. Pt informed by Estill Bamberg to call back in June to reschedule.

## 2017-11-12 NOTE — Telephone Encounter (Signed)
Left message for patient to call back  

## 2017-11-13 NOTE — Telephone Encounter (Signed)
Levonne Spiller, RN sent to Marlon Pel, RN        Good morning!! Pt called our office before her PV to reschedule the hospital colon with Dr.Stark but she was told we would take care of it in PV. I felt maybe a message should have been sent to office nurse before PV since when can not schedule office appointments. Please cancel the hospital colon she cannot come this date she will call us back in April to reschedule for June. Thank you,Robbin pv    Procedure cancelled.  I will await a call back from the patient

## 2017-11-15 NOTE — Telephone Encounter (Signed)
Patient notified that I will call her when the June hospital schedule is out.  She verbalized understanding

## 2017-11-15 NOTE — Telephone Encounter (Signed)
Patient returning phone call to nurse to reschedule hospital colonoscop. Best call back # [phone].

## 2017-11-21 ENCOUNTER — Ambulatory Visit (INDEPENDENT_AMBULATORY_CARE_PROVIDER_SITE_OTHER): Payer: Medicare Other | Admitting: Family Medicine

## 2017-11-21 ENCOUNTER — Encounter: Payer: Self-pay | Admitting: Family Medicine

## 2017-11-21 ENCOUNTER — Other Ambulatory Visit: Payer: Self-pay

## 2017-11-21 VITALS — BP 126/76 | HR 84 | Temp 98.5°F | Resp 16 | Ht 64.0 in | Wt 166.4 lb

## 2017-11-21 DIAGNOSIS — H6123 Impacted cerumen, bilateral: Secondary | ICD-10-CM | POA: Diagnosis not present

## 2017-11-21 NOTE — Patient Instructions (Signed)
     IF you received an x-ray today, you will receive an invoice from [facility]. Please contact [facility] at [phone] with questions or concerns regarding your invoice.   IF you received labwork today, you will receive an invoice from [facility]. Please contact [facility] at [phone] with questions or concerns regarding your invoice.   Our billing staff will not be able to assist you with questions regarding bills from these companies.  You will be contacted with the lab results as soon as they are available. The fastest way to get your results is to activate your My Chart account. Instructions are located on the last page of this paperwork. If you have not heard from us regarding the results in 2 weeks, please contact this office.     

## 2017-11-25 ENCOUNTER — Other Ambulatory Visit: Payer: Self-pay

## 2017-11-25 NOTE — Telephone Encounter (Signed)
Left message for patient to call back Colonoscopy scheduled on 02/10/18 0830.  Patient will need to arrive at 7:00

## 2017-11-25 NOTE — Telephone Encounter (Signed)
Patient notified She will come for pre-visit 01/27/18 2:00

## 2017-12-01 ENCOUNTER — Encounter: Payer: Self-pay | Admitting: Family Medicine

## 2017-12-01 NOTE — Progress Notes (Signed)
3/24/201911:59 AM  April [name] [birth year], 67 y.o. female [id number]  Chief Complaint  Patient presents with  . Cerumen Impaction    per patient in both ears x 2-3 days    HPI:   Patient is a 67 y.o. female who presents today requesting removal of ear wax. Has had this issue multiple times before. Has noticed muffled hearing and some pressure. No URI sx, no fevers.   Depression screen [facility] 2/9 11/21/2017 04/25/2017 10/16/2016  Decreased Interest 0 0 0  Down, Depressed, Hopeless 0 0 0  PHQ - 2 Score 0 0 0    Not on File  Prior to Admission medications   Medication Sig Start Date End Date Taking? Authorizing Provider  Ascorbic Acid (VITAMIN C) 1000 MG tablet Take 1,000 mg by mouth daily.     Yes [provider]  BETAINE PO Take 7 tablets by mouth 3 (three) times daily with meals.    Yes [provider]  Cholecalciferol (VITAMIN D3) 5000 UNITS CAPS Take 1 tablet by mouth daily.    Yes [provider]  Iodine Strong, Lugols, (IODINE STRONG PO) Take 1 capsule by mouth daily.    Yes [provider]  liothyronine (CYTOMEL) 5 MCG tablet 12.5 mcg daily.    Yes [provider]  Magnesium (MAGNESIUM GLYCINATE PLUS) 110 MG CAPS Take 2 capsules by mouth at bedtime.    Yes [provider]  Multiple Vitamins-Minerals (MULTIVITAMIN ADULT PO) Take by mouth.   Yes [provider]  Omega-3 Fatty Acids (FISH OIL) 1000 MG CAPS Take 2,150 mg by mouth 2 (two) times daily.   Yes [provider]  thyroid (ARMOUR) 60 MG tablet Take 120 mg by mouth every other day.    Yes [provider]  vitamin A 25000 UNIT capsule Take 25,000 Units by mouth daily.    Yes [provider]  VITAMIN K PO Take by mouth.   Yes [provider]  NON FORMULARY Take 2 capsules by mouth 3 (three) times daily with meals. Ultra nutrient supplement    [provider]  OVER THE COUNTER MEDICATION Take 3 tablets by mouth daily.  Mix RX supplement with each meal    [provider]    Past Medical History:  Diagnosis Date  . Anemia   . Bronchitis   . Cancer ([place])   . Candidiasis of intestine   . Cataract   . Complication of anesthesia    pt states they said it took 3 attempts to get tube down-small esophagus  . Fatigue   . Hypercholesteremia   . Hypochlorhydria   . Hypothyroidism   . Insomnia   . Mineral deficiency   . Obesity   . Radiculopathy, lumbar region   . Recurrent upper respiratory infection (URI)    COLD IN JAN....STILL HAS OCCAS. COUGHING  . Sleep apnea    pt verbalize she no longer has sleep apnea and doesn't use cpap  . Vitamin D deficiency   . Vitamin D deficiency     Past Surgical History:  Procedure Laterality Date  . COLONOSCOPY  08/25/2012   Procedure: COLONOSCOPY;  Surgeon: Ladene Artist, MD,FACG;  Location: [facility];  Service: Endoscopy;  Laterality: N/A;  . GALLBLADDER SURGERY    . LAPAROSCOPIC CHOLECYSTECTOMY    . LUNG BIOPSY    . MEDIASTERNOTOMY  10/31/2011   Procedure: MEDIASTERNOTOMY;  Surgeon: Grace Isaac, MD;  Location: [place];  Service: Thoracic;  Laterality: N/A;  RESECTION  OF THYMOMA, FULL STERNOTOMY  . stenotomy      STENOTOMY/THYMECTOMY  . TONSILLECTOMY      Social History   Tobacco Use  . Smoking status: Former Smoker    Packs/day: 1.00    Years: 17.00    Pack years: 17.00    Types: Cigarettes    Last attempt to quit: 09/26/1983    Years since quitting: 34.2  . Smokeless tobacco: Never Used  Substance Use Topics  . Alcohol use: Yes    Comment: rarely    Family History  Problem Relation Age of Onset  . Colon cancer Neg Hx   . Colon polyps Neg Hx   . Esophageal cancer Neg Hx   . Rectal cancer Neg Hx   . Stomach cancer Neg Hx     ROS Per hpi  OBJECTIVE:  Blood pressure 126/76, pulse 84, temperature 98.5 F (36.9 C), temperature source Oral, resp. rate 16, height 5\' 4"  (1.626 m), weight 166 lb 6.4 oz (75.5 kg), SpO2 97  %.  Physical Exam  Constitutional: She is oriented to person, place, and time and well-developed, well-nourished, and in no distress.  HENT:  Head: Normocephalic and atraumatic.  Mouth/Throat: Mucous membranes are normal.  Bilateral ears with impacted cerumen, removed successful, TMs normal.   Eyes: Pupils are equal, round, and reactive to light. EOM are normal. No scleral icterus.  Neck: Neck supple.  Pulmonary/Chest: Effort normal.  Neurological: She is alert and oriented to person, place, and time. Gait normal.  Skin: Skin is warm and dry.  Psychiatric: Mood and affect normal.  Nursing note and vitals reviewed.    ASSESSMENT and PLAN  1. Bilateral impacted cerumen - Ear wax removal  Return if symptoms worsen or fail to improve.    Rutherford Guys, MD Primary Care at [place], [place] [address] Ph.  [phone] Fax [phone]

## 2018-01-20 DIAGNOSIS — E063 Autoimmune thyroiditis: Secondary | ICD-10-CM | POA: Diagnosis not present

## 2018-01-20 DIAGNOSIS — D649 Anemia, unspecified: Secondary | ICD-10-CM | POA: Diagnosis not present

## 2018-01-20 DIAGNOSIS — E039 Hypothyroidism, unspecified: Secondary | ICD-10-CM | POA: Diagnosis not present

## 2018-01-20 DIAGNOSIS — H259 Unspecified age-related cataract: Secondary | ICD-10-CM | POA: Diagnosis not present

## 2018-01-20 DIAGNOSIS — B3789 Other sites of candidiasis: Secondary | ICD-10-CM | POA: Diagnosis not present

## 2018-01-20 DIAGNOSIS — E8881 Metabolic syndrome: Secondary | ICD-10-CM | POA: Diagnosis not present

## 2018-01-20 DIAGNOSIS — E559 Vitamin D deficiency, unspecified: Secondary | ICD-10-CM | POA: Diagnosis not present

## 2018-01-20 DIAGNOSIS — E509 Vitamin A deficiency, unspecified: Secondary | ICD-10-CM | POA: Diagnosis not present

## 2018-01-20 DIAGNOSIS — E781 Pure hyperglyceridemia: Secondary | ICD-10-CM | POA: Diagnosis not present

## 2018-01-20 DIAGNOSIS — B3782 Candidal enteritis: Secondary | ICD-10-CM | POA: Diagnosis not present

## 2018-01-20 DIAGNOSIS — E785 Hyperlipidemia, unspecified: Secondary | ICD-10-CM | POA: Diagnosis not present

## 2018-01-20 DIAGNOSIS — Z9229 Personal history of other drug therapy: Secondary | ICD-10-CM | POA: Diagnosis not present

## 2018-01-27 ENCOUNTER — Other Ambulatory Visit: Payer: Self-pay

## 2018-01-27 ENCOUNTER — Ambulatory Visit (AMBULATORY_SURGERY_CENTER): Payer: Self-pay | Admitting: *Deleted

## 2018-01-27 VITALS — Ht 64.0 in | Wt 161.6 lb

## 2018-01-27 DIAGNOSIS — Z86010 Personal history of colonic polyps: Secondary | ICD-10-CM

## 2018-01-27 MED ORDER — NA SULFATE-K SULFATE-MG SULF 17.5-3.13-1.6 GM/177ML PO SOLN: 1.0000 [IU] | Freq: Once | ORAL | 0 refills | Status: AC

## 2018-01-27 NOTE — Progress Notes (Signed)
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

## 2018-01-30 ENCOUNTER — Encounter (HOSPITAL_COMMUNITY): Payer: Self-pay

## 2018-01-30 ENCOUNTER — Other Ambulatory Visit: Payer: Self-pay

## 2018-02-09 NOTE — Anesthesia Preprocedure Evaluation (Addendum)
Anesthesia Evaluation  Patient identified by MRN, date of birth, ID band Patient awake    Reviewed: Allergy & Precautions, NPO status , Patient's Chart, lab work & pertinent test results  History of Anesthesia Complications (+) DIFFICULT AIRWAYHistory of anesthetic complications: difficult intubation for thymectomy.  Airway Mallampati: I  TM Distance: >3 FB Neck ROM: Full    Dental  (+) Caps, Dental Advisory Given   Pulmonary sleep apnea (no longer requires CPAP) , former smoker,    breath sounds clear to auscultation       Cardiovascular (-) angina Rhythm:Regular Rate:Normal  '12 ECHO: normal LVF, valves OK   Neuro/Psych negative neurological ROS     GI/Hepatic Neg liver ROS, GERD  Controlled,  Endo/Other  Hypothyroidism Morbid obesity  Renal/GU negative Renal ROS     Musculoskeletal   Abdominal   Peds  Hematology negative hematology ROS (+)   Anesthesia Other Findings   Reproductive/Obstetrics                            Anesthesia Physical Anesthesia Plan  ASA: II  Anesthesia Plan: MAC   Post-op Pain Management:    Induction:   PONV Risk Score and Plan: 2 and Treatment may vary due to age or medical condition  Airway Management Planned: Natural Airway and Nasal Cannula  Additional Equipment:   Intra-op Plan:   Post-operative Plan:   Informed Consent: I have reviewed the patients History and Physical, chart, labs and discussed the procedure including the risks, benefits and alternatives for the proposed anesthesia with the patient or authorized representative who has indicated his/her understanding and acceptance.   Dental advisory given  Plan Discussed with: CRNA and Surgeon  Anesthesia Plan Comments: (Plan routine monitors, MAC)       Anesthesia Quick Evaluation

## 2018-02-10 ENCOUNTER — Ambulatory Visit (HOSPITAL_COMMUNITY): Payer: Medicare Other | Admitting: Anesthesiology

## 2018-02-10 ENCOUNTER — Other Ambulatory Visit: Payer: Self-pay

## 2018-02-10 ENCOUNTER — Ambulatory Visit (HOSPITAL_COMMUNITY)
Admission: RE | Admit: 2018-02-10 | Discharge: 2018-02-10 | Disposition: A | Discharge status: Home / Self Care | Payer: Medicare Other | Source: Ambulatory Visit | Attending: Gastroenterology | Admitting: Gastroenterology

## 2018-02-10 ENCOUNTER — Encounter (HOSPITAL_COMMUNITY)
Admission: RE | Disposition: A | Discharge status: Home / Self Care | Payer: Self-pay | Source: Ambulatory Visit | Attending: Gastroenterology

## 2018-02-10 ENCOUNTER — Encounter (HOSPITAL_COMMUNITY): Payer: Self-pay | Admitting: *Deleted

## 2018-02-10 DIAGNOSIS — E039 Hypothyroidism, unspecified: Secondary | ICD-10-CM | POA: Diagnosis not present

## 2018-02-10 DIAGNOSIS — G4730 Sleep apnea, unspecified: Secondary | ICD-10-CM | POA: Insufficient documentation

## 2018-02-10 DIAGNOSIS — Z6826 Body mass index (BMI) 26.0-26.9, adult: Secondary | ICD-10-CM | POA: Diagnosis not present

## 2018-02-10 DIAGNOSIS — Z1211 Encounter for screening for malignant neoplasm of colon: Secondary | ICD-10-CM | POA: Insufficient documentation

## 2018-02-10 DIAGNOSIS — K5730 Diverticulosis of large intestine without perforation or abscess without bleeding: Secondary | ICD-10-CM | POA: Insufficient documentation

## 2018-02-10 DIAGNOSIS — Z79899 Other long term (current) drug therapy: Secondary | ICD-10-CM | POA: Diagnosis not present

## 2018-02-10 DIAGNOSIS — Z86010 Personal history of colonic polyps: Secondary | ICD-10-CM

## 2018-02-10 DIAGNOSIS — Z87891 Personal history of nicotine dependence: Secondary | ICD-10-CM | POA: Insufficient documentation

## 2018-02-10 DIAGNOSIS — D125 Benign neoplasm of sigmoid colon: Secondary | ICD-10-CM

## 2018-02-10 DIAGNOSIS — D121 Benign neoplasm of appendix: Secondary | ICD-10-CM | POA: Diagnosis not present

## 2018-02-10 DIAGNOSIS — K388 Other specified diseases of appendix: Secondary | ICD-10-CM | POA: Diagnosis not present

## 2018-02-10 DIAGNOSIS — K219 Gastro-esophageal reflux disease without esophagitis: Secondary | ICD-10-CM | POA: Insufficient documentation

## 2018-02-10 DIAGNOSIS — D120 Benign neoplasm of cecum: Secondary | ICD-10-CM

## 2018-02-10 HISTORY — PX: POLYPECTOMY: SHX5525

## 2018-02-10 HISTORY — PX: COLONOSCOPY WITH PROPOFOL: SHX5780

## 2018-02-10 HISTORY — DX: Pneumonia, unspecified organism: J18.9

## 2018-02-10 SURGERY — COLONOSCOPY WITH PROPOFOL
Anesthesia: Monitor Anesthesia Care

## 2018-02-10 MED ORDER — PROPOFOL 10 MG/ML IV BOLUS
INTRAVENOUS | Status: AC
  Filled 2018-02-10: qty 40

## 2018-02-10 MED ORDER — LACTATED RINGERS IV SOLN
INTRAVENOUS | Status: DC
  Administered 2018-02-10: 08:00:00 via INTRAVENOUS

## 2018-02-10 MED ORDER — PROPOFOL 10 MG/ML IV BOLUS
INTRAVENOUS | Status: DC | PRN
  Administered 2018-02-10: 10 mg via INTRAVENOUS

## 2018-02-10 MED ORDER — ONDANSETRON HCL 4 MG/2ML IJ SOLN
INTRAMUSCULAR | Status: DC | PRN
  Administered 2018-02-10: 4 mg via INTRAVENOUS

## 2018-02-10 MED ORDER — PROPOFOL 500 MG/50ML IV EMUL
INTRAVENOUS | Status: DC | PRN
  Administered 2018-02-10: 200 ug/kg/min via INTRAVENOUS

## 2018-02-10 SURGICAL SUPPLY — 21 items

## 2018-02-10 NOTE — Op Note (Addendum)
[facility] Patient Name: April [name] Procedure Date: 02/10/2018 MRN: [id number] Attending MD: Ladene Artist , MD Date of Birth: 05-19-1951 CSN: [id number] Age: 67 Admit Type: Outpatient Procedure:                Colonoscopy Indications:              Surveillance: Personal history of adenomatous                            polyps on last colonoscopy 5 years ago Providers:                Pricilla Riffle. Fuller Plan, MD, Presley Raddle, RN, Cherylynn Ridges, Technician, Virgia Land, CRNA Referring MD:             [name], MD Medicines:                Monitored Anesthesia Care Complications:            No immediate complications. Estimated blood loss:                            None. Estimated Blood Loss:     Estimated blood loss: none. Procedure:                Pre-Anesthesia Assessment:                           - Prior to the procedure, a History and Physical                            was performed, and patient medications and                            allergies were reviewed. The patient's tolerance of                            previous anesthesia was also reviewed. The risks                            and benefits of the procedure and the sedation                            options and risks were discussed with the patient.                            All questions were answered, and informed consent                            was obtained. Prior Anticoagulants: The patient has                            taken no previous anticoagulant or antiplatelet  agents. ASA Grade Assessment: II - A patient with                            mild systemic disease. After reviewing the risks                            and benefits, the patient was deemed in                            satisfactory condition to undergo the procedure.                           After obtaining informed consent, the colonoscope   was passed under direct vision. Throughout the                            procedure, the patient's blood pressure, pulse, and                            oxygen saturations were monitored continuously. The                            EC-[id number] ([id number]) scope was introduced through                            the anus and advanced to the the cecum, identified                            by appendiceal orifice and ileocecal valve. The                            ileocecal valve, appendiceal orifice, and rectum                            were photographed. The quality of the bowel                            preparation was good. The colonoscopy was performed                            without difficulty. The patient tolerated the                            procedure well. Techinical problems with photos was                            corrected near end of procedure. Multiple photos                            did not capture. Scope In: 8:40:12 AM Scope Out: 8:58:38 AM Scope Withdrawal Time: 0 hours 14 minutes 16 seconds  Total Procedure Duration: 0 hours 18 minutes 26 seconds  Findings:      The perianal and digital rectal examinations were normal.  Two sessile polyps were found in the sigmoid colon and appendiceal       orifice. The polyps were 6 mm in size. These polyps were removed with a       cold snare. Resection and retrieval were complete.      Multiple medium-mouthed diverticula were found in the left colon. There       was no evidence of diverticular bleeding.      The exam was otherwise without abnormality on direct and retroflexion       views. Impression:               - Two 6 mm polyps in the sigmoid colon and at the                            appendiceal orifice, removed with a cold snare.                            Resected and retrieved.                           - Moderate diverticulosis in the left colon. There                            was no evidence of diverticular  bleeding.                           - The examination was otherwise normal on direct                            and retroflexion views. Moderate Sedation:      N/A- Per Anesthesia Care Recommendation:           - Repeat colonoscopy in 5 years for surveillance.                           - Patient has a contact number available for                            emergencies. The signs and symptoms of potential                            delayed complications were discussed with the                            patient. Return to normal activities tomorrow.                            Written discharge instructions were provided to the                            patient.                           - Resume previous diet.                           - Continue  present medications.                           - Await pathology results. Procedure Code(s):        --- Professional ---                           [id number], Colonoscopy, flexible; with removal of                            tumor(s), polyp(s), or other lesion(s) by snare                            technique Diagnosis Code(s):        --- Professional ---                           Z86.010, Personal history of colonic polyps                           D12.5, Benign neoplasm of sigmoid colon                           D12.1, Benign neoplasm of appendix                           K57.30, Diverticulosis of large intestine without                            perforation or abscess without bleeding CPT copyright 2017 [employer]. All rights reserved. The codes documented in this report are preliminary and upon coder review may  be revised to meet current compliance requirements. Ladene Artist, MD 02/10/2018 9:04:43 AM This report has been signed electronically. Number of Addenda: 0

## 2018-02-10 NOTE — Transfer of Care (Signed)
Immediate Anesthesia Transfer of Care Note  Patient: April [name]  Procedure(s) Performed: COLONOSCOPY WITH PROPOFOL (N/A ) POLYPECTOMY  Patient Location: PACU  Anesthesia Type:MAC  Level of Consciousness: awake, alert  and oriented  Airway & Oxygen Therapy: Patient Spontanous Breathing and Patient connected to face mask oxygen  Post-op Assessment: Report given to RN  Post vital signs: Reviewed and stable  Last Vitals:  Vitals Value Taken Time  BP    Temp    Pulse    Resp    SpO2      Last Pain:  Vitals:   02/10/18 0735  TempSrc: Oral  PainSc: 0-No pain         Complications: No apparent anesthesia complications

## 2018-02-10 NOTE — Anesthesia Postprocedure Evaluation (Signed)
Anesthesia Post Note  Patient: April [name]  Procedure(s) Performed: COLONOSCOPY WITH PROPOFOL (N/A ) POLYPECTOMY     Patient location during evaluation: Endoscopy Anesthesia Type: MAC Level of consciousness: awake and alert, oriented and patient cooperative Pain management: pain level controlled Vital Signs Assessment: post-procedure vital signs reviewed and stable Respiratory status: spontaneous breathing, nonlabored ventilation and respiratory function stable Cardiovascular status: blood pressure returned to baseline and stable Postop Assessment: no apparent nausea or vomiting Anesthetic complications: no    Last Vitals:  Vitals:   02/10/18 0902 02/10/18 0910  BP: (!) 93/52 100/65  Pulse: (!) 56 (!) 59  Resp: 15 14  Temp: 36.4 C   SpO2: 100% 100%    Last Pain:  Vitals:   02/10/18 0902  TempSrc: Oral  PainSc: 0-No pain                 Randee Huston,E. Jaxn Chiquito

## 2018-02-10 NOTE — Discharge Instructions (Signed)

## 2018-02-10 NOTE — H&P (Signed)
ADMISSION NOTE  Primary Care Physician:  [name], MD Primary Gastroenterologist:  Lucio Edward,  MD  CHIEF COMPLAINT:  Personal history of colon polyps  HPI: April [name] is a 67 y.o. female with a history of adenomatous colon polyps. Last colonoscopy in 08/2012 had one polyp and diverticulosis. She related looser stools 1-2/day for past 2 years. Symptoms improved with psyllium supplements however she stopped this. Denies weight loss, abdominal pain, constipation, change in stool caliber, melena, hematochezia, nausea, vomiting, dysphagia, reflux symptoms, chest pain.   Past Medical History:  Diagnosis Date  . Anemia    iron  . Bronchitis   . Cancer ([place])    thymoma   . Candidiasis of intestine   . Cataract    beginning stages   . Complication of anesthesia    pt states they said it took 3 attempts to get tube down-small esophagus  . Fatigue   . GERD (gastroesophageal reflux disease)   . Hypercholesteremia   . Hypochlorhydria   . Hypothyroidism   . Insomnia   . Mineral deficiency   . Obesity   . Osteopenia   . Pneumonia   . Radiculopathy, lumbar region   . Recurrent upper respiratory infection (URI)    COLD IN JAN....STILL HAS OCCAS. COUGHING  . Sleep apnea    pt verbalize she no longer has sleep apnea and doesn't use cpap  . Vitamin D deficiency   . Vitamin D deficiency     Past Surgical History:  Procedure Laterality Date  . COLONOSCOPY  08/25/2012   Procedure: COLONOSCOPY;  Surgeon: Ladene Artist, MD,FACG;  Location: [facility];  Service: Endoscopy;  Laterality: N/A;  . COLONOSCOPY    . GALLBLADDER SURGERY    . LAPAROSCOPIC CHOLECYSTECTOMY    . LUNG BIOPSY    . MEDIASTERNOTOMY  10/31/2011   Procedure: MEDIASTERNOTOMY;  Surgeon: Grace Isaac, MD;  Location: [place];  Service: Thoracic;  Laterality: N/A;  RESECTION OF THYMOMA, FULL STERNOTOMY  . POLYPECTOMY    . stenotomy      STENOTOMY/THYMECTOMY  . TONSILLECTOMY      Prior to Admission  medications   Medication Sig Start Date End Date Taking? Authorizing Provider  BETAINE PO Take 5 tablets by mouth 3 (three) times daily with meals.    Yes [provider]  Digestive Enzymes (DIGESTIVE ENZYME PO) Take 1 tablet by mouth 3 (three) times daily with meals.   Yes [provider]  liothyronine (CYTOMEL) 25 MCG tablet Take 12.5 mcg by mouth daily with lunch.   Yes [provider]  Magnesium 250 MG TABS Take 500 mg by mouth at bedtime.   Yes [provider]  Menaquinone-7 (VITAMIN K2 PO) Take 15,000 mcg by mouth daily.   Yes [provider]  Methylcobalamin 5000 MCG SUBL Place 5,000 mcg under the tongue daily.   Yes [provider]  Multiple Vitamins-Minerals (MULTIVITAMIN ADULT PO) Take 3 tablets by mouth daily.    Yes [provider]  NON FORMULARY Take 125 mg by mouth daily. Annatto tocotrienols  125 mg otc supplement   Yes [provider]  NON FORMULARY Take 1 Dose by mouth at bedtime. serrapeptase   250000 SPU   Yes [provider]  NON FORMULARY Take 1 tablet by mouth daily. Glycoberine-MX  otc supplement   Yes [provider]  Omega-3 Fatty Acids (FISH OIL PO) Take 2,150 mg by mouth 2 (two) times daily.   Yes [provider]  OVER  THE COUNTER MEDICATION Hemagenics  1 tablet po daily with food   Yes [provider]  OVER THE COUNTER MEDICATION Take 12.5 mg by mouth daily. Iodine otc supplement   Yes [provider]  Probiotic Product (PROBIOTIC DAILY PO) Take 1 capsule by mouth 2 (two) times daily.    Yes [provider]  thyroid (ARMOUR) 120 MG tablet Take 60-120 mg by mouth See admin instructions. Take 120 mg on day 1 then 60 mg on day 2, alternating 120 and 60 mg every other day   Yes [provider]  vitamin A 25000 UNIT capsule Take 25,000 Units by mouth daily.    Yes [provider]  Vitamin D-Vitamin K (D3 + K2 DOTS PO) Take 1 tablet by  mouth daily.   Yes [provider]  vitamin E 400 UNIT capsule Take 400 Units by mouth daily.   Yes [provider]    Current Facility-Administered Medications  Medication Dose Route Frequency Provider Last Rate Last Dose  . lactated ringers infusion   Intravenous Continuous Ladene Artist, MD 20 mL/hr at 02/10/18 0746      Allergies as of 10/14/2017  . (No Known Allergies)    Family History  Problem Relation Age of Onset  . Colon cancer Neg Hx   . Colon polyps Neg Hx   . Esophageal cancer Neg Hx   . Rectal cancer Neg Hx   . Stomach cancer Neg Hx     Social History   Socioeconomic History  . Marital status: Married    Spouse name: Not on file  . Number of children: Not on file  . Years of education: Not on file  . Highest education level: Not on file  Occupational History  . Not on file  Social Needs  . Financial resource strain: Not on file  . Food insecurity:    Worry: Not on file    Inability: Not on file  . Transportation needs:    Medical: Not on file    Non-medical: Not on file  Tobacco Use  . Smoking status: Former Smoker    Packs/day: 1.00    Years: 17.00    Pack years: 17.00    Types: Cigarettes    Last attempt to quit: 09/26/1983    Years since quitting: 34.4  . Smokeless tobacco: Never Used  Substance and Sexual Activity  . Alcohol use: Yes    Comment: occasional  . Drug use: No  . Sexual activity: Yes  Lifestyle  . Physical activity:    Days per week: Not on file    Minutes per session: Not on file  . Stress: Not on file  Relationships  . Social connections:    Talks on phone: Not on file    Gets together: Not on file    Attends religious service: Not on file    Active member of club or organization: Not on file    Attends meetings of clubs or organizations: Not on file    Relationship status: Not on file  . Intimate partner violence:    Fear of current or ex partner: Not on file    Emotionally abused: Not on file     Physically abused: Not on file    Forced sexual activity: Not on file  Other Topics Concern  . Not on file  Social History Narrative  . Not on file    Review of Systems:  All systems reviewed an negative except where noted in  HPI.  Gen: Denies any fever, chills, sweats, anorexia, fatigue, weakness, malaise, weight loss, and sleep disorder CV: Denies chest pain, angina, palpitations, syncope, orthopnea, PND, peripheral edema, and claudication. Resp: Denies dyspnea at rest, dyspnea with exercise, cough, sputum, wheezing, coughing up blood, and pleurisy. GI: Denies vomiting blood, jaundice, and fecal incontinence.   Denies dysphagia or odynophagia. GU : Denies urinary burning, blood in urine, urinary frequency, urinary hesitancy, nocturnal urination, and urinary incontinence. MS: Denies joint pain, limitation of movement, and swelling, stiffness, low back pain, extremity pain. Denies muscle weakness, cramps, atrophy.  Derm: Denies rash, itching, dry skin, hives, moles, warts, or unhealing ulcers.  Psych: Denies depression, anxiety, memory loss, suicidal ideation, hallucinations, paranoia, and confusion. Heme: Denies bruising, bleeding, and enlarged lymph nodes. Neuro:  Denies any headaches, dizziness, paresthesias. Endo:  Denies any problems with DM, thyroid, adrenal function.  Physical Exam: Vital signs in last 24 hours: Temp:  [98.3 F (36.8 C)] 98.3 F (36.8 C) (06/03 0735) Pulse Rate:  [64] 64 (06/03 0735) Resp:  [12] 12 (06/03 0735) BP: (114)/(73) 114/73 (06/03 0735) SpO2:  [100 %] 100 % (06/03 0735) Weight:  [156 lb (70.8 kg)] 156 lb (70.8 kg) (06/03 0735)   General:  Alert, well-developed, in NAD Head:  Normocephalic and atraumatic. Eyes:  Sclera clear, no icterus.   Conjunctiva pink. Ears:  Normal auditory acuity. Mouth:  No deformity or lesions.  Neck:  Supple; no masses . Lungs:  Clear throughout to auscultation.   No wheezes, crackles, or rhonchi. No acute  distress. Heart:  Regular rate and rhythm; no murmurs. Abdomen:  Soft, nondistended, nontender. No masses, hepatomegaly. No obvious masses.  Normal bowel .    Rectal:  Deferred to colonoscopy  Msk:  Symmetrical without gross deformities.. Pulses:  Normal pulses noted. Extremities:  Without edema. Neurologic:  Alert and  oriented x4;  grossly normal neurologically. Skin:  Intact without significant lesions or rashes. Cervical Nodes:  No significant cervical adenopathy. Psych:  Alert and cooperative. Normal mood and affect.   Impression / Plan:   1. Personal history of adenomatous colon polyps. Due for 5 year surveillance. The risks (including bleeding, perforation, infection, missed lesions, medication reactions and possible hospitalization or surgery if complications occur), benefits, and alternatives to colonoscopy with possible biopsy and possible polypectomy were discussed with the patient and they consent to proceed.     LOS: 0 days   Kerron Sedano T. Fuller Plan MD 02/10/2018, 8:31 AM [phone] office

## 2018-02-11 ENCOUNTER — Encounter (HOSPITAL_COMMUNITY): Payer: Self-pay | Admitting: Gastroenterology

## 2018-02-12 ENCOUNTER — Encounter: Payer: Self-pay | Admitting: Gastroenterology

## 2018-02-14 ENCOUNTER — Ambulatory Visit (INDEPENDENT_AMBULATORY_CARE_PROVIDER_SITE_OTHER): Payer: Medicare Other | Admitting: Physician Assistant

## 2018-02-14 ENCOUNTER — Other Ambulatory Visit: Payer: Self-pay

## 2018-02-14 ENCOUNTER — Encounter: Payer: Self-pay | Admitting: Physician Assistant

## 2018-02-14 VITALS — BP 116/66 | HR 63 | Temp 97.5°F | Resp 16 | Ht 63.75 in | Wt 161.0 lb

## 2018-02-14 DIAGNOSIS — H6123 Impacted cerumen, bilateral: Secondary | ICD-10-CM

## 2018-02-14 NOTE — Progress Notes (Signed)
ZAMZAM WHINERY  MRN: [id number] DOB: [birth year]  PCP: [name], MD  Subjective:  Pt is a 67 year old female who presents to clinic for earwax removal of both ears.  This is an intermittent and chronic problem for her. Denies tinnitus, balance issues, drainage, headache, pain, fever, chills.  Review of Systems  Constitutional: Negative for chills and fever.  HENT: Positive for hearing loss (Decreased hearing bilateral ears). Negative for ear discharge and ear pain.   Respiratory: Negative for cough and wheezing.     Patient Active Problem List   Diagnosis Date Noted  . Benign neoplasm of cecum   . Benign neoplasm of sigmoid colon 08/25/2012  . Hx of adenomatous colonic polyps 08/25/2012  . Hypothyroid 08/24/2011    Current Outpatient Medications on File Prior to Visit  Medication Sig Dispense Refill  . BETAINE PO Take 5 tablets by mouth 3 (three) times daily with meals.     . Digestive Enzymes (DIGESTIVE ENZYME PO) Take 1 tablet by mouth 3 (three) times daily with meals.    Marland Kitchen liothyronine (CYTOMEL) 25 MCG tablet Take 12.5 mcg by mouth daily with lunch.    . Magnesium 250 MG TABS Take 500 mg by mouth at bedtime.    . Methylcobalamin 5000 MCG SUBL Place 5,000 mcg under the tongue daily.    . Multiple Vitamins-Minerals (MULTIVITAMIN ADULT PO) Take 3 tablets by mouth daily.     . NON FORMULARY Take 125 mg by mouth daily. Annatto tocotrienols  125 mg otc supplement    . NON FORMULARY Take 1 Dose by mouth at bedtime. serrapeptase   250000 SPU    . NON FORMULARY Take 1 tablet by mouth daily. Glycoberine-MX  otc supplement    . Omega-3 Fatty Acids (FISH OIL PO) Take 2,150 mg by mouth 2 (two) times daily.    Marland Kitchen OVER THE COUNTER MEDICATION Hemagenics  1 tablet po daily with food    . OVER THE COUNTER MEDICATION Take 12.5 mg by mouth daily. Iodine otc supplement    . Probiotic Product (PROBIOTIC DAILY PO) Take 1 capsule by mouth 2 (two) times daily.     Marland Kitchen thyroid (ARMOUR) 120 MG tablet  Take 60-120 mg by mouth See admin instructions. Take 120 mg on day 1 then 60 mg on day 2, alternating 120 and 60 mg every other day    . vitamin A 25000 UNIT capsule Take 25,000 Units by mouth daily.     . Vitamin D-Vitamin K (D3 + K2 DOTS PO) Take 1 tablet by mouth daily.    . vitamin E 400 UNIT capsule Take 400 Units by mouth daily.    . Menaquinone-7 (VITAMIN K2 PO) Take 15,000 mcg by mouth daily.     No current facility-administered medications on file prior to visit.     No Known Allergies   Objective:  BP 116/66 (BP Location: Left Arm, Patient Position: Sitting, Cuff Size: Normal)   Pulse 63   Temp (!) 97.5 F (36.4 C) (Oral)   Resp 16   Ht 5' 3.75" (1.619 m)   Wt 161 lb (73 kg)   SpO2 97%   BMI 27.85 kg/m   Physical Exam  Constitutional: She is oriented to person, place, and time. No distress.  HENT:  Cerumen impaction bilateral ear canals. After ear lavage canals are normal and tympanic membranes are pearly gray.  Hearing improved  Neurological: She is alert and oriented to person, place, and time.  Skin: Skin is  warm and dry.  Psychiatric: Judgment normal.  Vitals reviewed.   Assessment and Plan :  1. Bilateral impacted cerumen -Patient complaining of bilateral cerumen impaction.  Ear lavage performed by CMA without difficulty, followed by maximum relief.  Discussed with patient home care.  Return to clinic as needed for ear lavage - Ear wax removal   Mercer Pod, PA-C  [facility] 02/14/2018 3:35 PM

## 2018-02-14 NOTE — Patient Instructions (Addendum)
  Please do not use Q-tips, as this will further impact the ear wax in your ear.   If you have cerumen impaction more than once a year you can reduce the occurrences by using a cotton ball dipped in mineral oil and place it in the external canal for 10-20 minutes once a week (combined with eight hours of not using a hearing aid overnight, if applicable). This helps liquify cerumen and aid in the normal elimination mechanisms.    You can also try a liquid stool softener. It works by dissolving or loosening the earwax, allowing it to be more easily removed upon irrigation. Lie on your side with the affected ear facing up and instill warmed (not hot) 1 mL (about 15 drops) of liquid Colace into the ear. Let the liquid sit in the ear for 10-15 minutes so it can work. After 15 minutes, rinse the affected ear with warm water so that you can get the softened earwax out of your canal.   Routine cleaning of ears by a health professional every 6-12 months is recommended.   If you have itchy ears, use sweet oil. If you need more relief use a small amount of hydrocortisone ointment.   Thank you for coming in today. I hope you feel we met your needs.  Feel free to call PCP if you have any questions or further requests.  Please consider signing up for MyChart if you do not already have it, as this is a great way to communicate with me.  Best,  Whitney McVey, PA-C   IF you received an x-ray today, you will receive an invoice from [facility]. Please contact [facility] at [phone] with questions or concerns regarding your invoice.   IF you received labwork today, you will receive an invoice from [facility]. Please contact [facility] at [phone] with questions or concerns regarding your invoice.   Our billing staff will not be able to assist you with questions regarding bills from these companies.  You will be contacted with the lab results as soon as they are available. The fastest way  to get your results is to activate your My Chart account. Instructions are located on the last page of this paperwork. If you have not heard from [place] regarding the results in 2 weeks, please contact this office.

## 2018-04-30 DIAGNOSIS — E8881 Metabolic syndrome: Secondary | ICD-10-CM | POA: Diagnosis not present

## 2018-04-30 DIAGNOSIS — E509 Vitamin A deficiency, unspecified: Secondary | ICD-10-CM | POA: Diagnosis not present

## 2018-04-30 DIAGNOSIS — B3789 Other sites of candidiasis: Secondary | ICD-10-CM | POA: Diagnosis not present

## 2018-04-30 DIAGNOSIS — E781 Pure hyperglyceridemia: Secondary | ICD-10-CM | POA: Diagnosis not present

## 2018-04-30 DIAGNOSIS — E559 Vitamin D deficiency, unspecified: Secondary | ICD-10-CM | POA: Diagnosis not present

## 2018-04-30 DIAGNOSIS — E785 Hyperlipidemia, unspecified: Secondary | ICD-10-CM | POA: Diagnosis not present

## 2018-04-30 DIAGNOSIS — B3782 Candidal enteritis: Secondary | ICD-10-CM | POA: Diagnosis not present

## 2018-04-30 DIAGNOSIS — D649 Anemia, unspecified: Secondary | ICD-10-CM | POA: Diagnosis not present

## 2018-04-30 DIAGNOSIS — E063 Autoimmune thyroiditis: Secondary | ICD-10-CM | POA: Diagnosis not present

## 2018-04-30 DIAGNOSIS — E039 Hypothyroidism, unspecified: Secondary | ICD-10-CM | POA: Diagnosis not present

## 2018-06-18 DIAGNOSIS — H838X3 Other specified diseases of inner ear, bilateral: Secondary | ICD-10-CM | POA: Diagnosis not present

## 2018-06-18 DIAGNOSIS — H6123 Impacted cerumen, bilateral: Secondary | ICD-10-CM | POA: Diagnosis not present

## 2018-06-18 DIAGNOSIS — H903 Sensorineural hearing loss, bilateral: Secondary | ICD-10-CM | POA: Diagnosis not present

## 2018-06-26 ENCOUNTER — Other Ambulatory Visit (INDEPENDENT_AMBULATORY_CARE_PROVIDER_SITE_OTHER): Payer: Self-pay | Admitting: Otolaryngology

## 2018-06-26 ENCOUNTER — Other Ambulatory Visit: Payer: Self-pay | Admitting: Otolaryngology

## 2018-06-26 DIAGNOSIS — H918X9 Other specified hearing loss, unspecified ear: Secondary | ICD-10-CM

## 2018-06-26 DIAGNOSIS — H918X3 Other specified hearing loss, bilateral: Secondary | ICD-10-CM

## 2018-07-14 ENCOUNTER — Ambulatory Visit
Admission: RE | Admit: 2018-07-14 | Discharge: 2018-07-14 | Disposition: A | Discharge status: Home / Self Care | Payer: Medicare Other | Source: Ambulatory Visit | Attending: Otolaryngology | Admitting: Otolaryngology

## 2018-07-14 DIAGNOSIS — H9193 Unspecified hearing loss, bilateral: Secondary | ICD-10-CM | POA: Diagnosis not present

## 2018-07-14 DIAGNOSIS — H918X9 Other specified hearing loss, unspecified ear: Secondary | ICD-10-CM

## 2018-07-14 MED ORDER — GADOBENATE DIMEGLUMINE 529 MG/ML IV SOLN
15.0000 mL | Freq: Once | INTRAVENOUS | Status: AC | PRN
  Administered 2018-07-14: 15 mL via INTRAVENOUS

## 2018-07-28 ENCOUNTER — Other Ambulatory Visit: Payer: Self-pay | Admitting: Dermatology

## 2018-07-28 DIAGNOSIS — B078 Other viral warts: Secondary | ICD-10-CM | POA: Diagnosis not present

## 2018-07-28 DIAGNOSIS — D485 Neoplasm of uncertain behavior of skin: Secondary | ICD-10-CM | POA: Diagnosis not present

## 2018-08-15 DIAGNOSIS — H2513 Age-related nuclear cataract, bilateral: Secondary | ICD-10-CM | POA: Diagnosis not present

## 2018-08-15 DIAGNOSIS — H5213 Myopia, bilateral: Secondary | ICD-10-CM | POA: Diagnosis not present

## 2018-09-17 ENCOUNTER — Ambulatory Visit
Admission: EM | Admit: 2018-09-17 | Discharge: 2018-09-17 | Disposition: A | Discharge status: Home / Self Care | Payer: Medicare Other | Attending: Family Medicine | Admitting: Family Medicine

## 2018-09-17 DIAGNOSIS — H6123 Impacted cerumen, bilateral: Secondary | ICD-10-CM | POA: Insufficient documentation

## 2018-09-17 NOTE — Discharge Instructions (Addendum)
We removed the wax  from your ears °Follow up as needed for continued or worsening symptoms ° ° °

## 2018-09-17 NOTE — ED Provider Notes (Signed)
[facility]    CSN: [id number] Arrival date & time: 09/17/18  1312     History   Chief Complaint Chief Complaint  Patient presents with  . ear wax removal    HPI April [name] is a 68 y.o. female.   Pt is a 68 year old female that presents with bilateral cerumen impaction. She was at a meeting today and was having trouble hearing and feeling fullness in her ears. She denies any pain. She has hx of cerumen impactions. She did use some debrox this am without relief. No cough, congestion, fevers. Denies any foreign body I nthe ears.   ROS per HPI      Past Medical History:  Diagnosis Date  . Anemia    iron  . Bronchitis   . Cancer ([place])    thymoma   . Candidiasis of intestine   . Cataract    beginning stages   . Complication of anesthesia    pt states they said it took 3 attempts to get tube down-small esophagus  . Fatigue   . GERD (gastroesophageal reflux disease)   . Hypercholesteremia   . Hypochlorhydria   . Hypothyroidism   . Insomnia   . Mineral deficiency   . Obesity   . Osteopenia   . Pneumonia   . Radiculopathy, lumbar region   . Recurrent upper respiratory infection (URI)    COLD IN JAN....STILL HAS OCCAS. COUGHING  . Sleep apnea    pt verbalize she no longer has sleep apnea and doesn't use cpap  . Vitamin D deficiency   . Vitamin D deficiency     Patient Active Problem List   Diagnosis Date Noted  . Benign neoplasm of cecum   . Benign neoplasm of sigmoid colon 08/25/2012  . Hx of adenomatous colonic polyps 08/25/2012  . Hypothyroid 08/24/2011    Past Surgical History:  Procedure Laterality Date  . COLONOSCOPY  08/25/2012   Procedure: COLONOSCOPY;  Surgeon: Ladene Artist, MD,FACG;  Location: [facility];  Service: Endoscopy;  Laterality: N/A;  . COLONOSCOPY    . COLONOSCOPY WITH PROPOFOL N/A 02/10/2018   Procedure: COLONOSCOPY WITH PROPOFOL;  Surgeon: Ladene Artist, MD;  Location: [facility];  Service: Endoscopy;   Laterality: N/A;  . GALLBLADDER SURGERY    . LAPAROSCOPIC CHOLECYSTECTOMY    . LUNG BIOPSY    . MEDIASTERNOTOMY  10/31/2011   Procedure: MEDIASTERNOTOMY;  Surgeon: Grace Isaac, MD;  Location: [place];  Service: Thoracic;  Laterality: N/A;  RESECTION OF THYMOMA, FULL STERNOTOMY  . POLYPECTOMY    . POLYPECTOMY  02/10/2018   Procedure: POLYPECTOMY;  Surgeon: Ladene Artist, MD;  Location: [facility];  Service: Endoscopy;;  . stenotomy      STENOTOMY/THYMECTOMY  . TONSILLECTOMY      OB History   No obstetric history on file.      Home Medications    Prior to Admission medications   Medication Sig Start Date End Date Taking? Authorizing Provider  BETAINE PO Take 5 tablets by mouth 3 (three) times daily with meals.     [provider]  Digestive Enzymes (DIGESTIVE ENZYME PO) Take 1 tablet by mouth 3 (three) times daily with meals.    [provider]  liothyronine (CYTOMEL) 25 MCG tablet Take 12.5 mcg by mouth daily with lunch.    [provider]  Magnesium 250 MG TABS Take 500 mg by mouth at bedtime.    [provider]  Menaquinone-7 (VITAMIN K2  PO) Take 15,000 mcg by mouth daily.    [provider]  Methylcobalamin 5000 MCG SUBL Place 5,000 mcg under the tongue daily.    [provider]  Multiple Vitamins-Minerals (MULTIVITAMIN ADULT PO) Take 3 tablets by mouth daily.     [provider]  NON FORMULARY Take 125 mg by mouth daily. Annatto tocotrienols  125 mg otc supplement    [provider]  NON FORMULARY Take 1 Dose by mouth at bedtime. serrapeptase   250000 SPU    [provider]  NON FORMULARY Take 1 tablet by mouth daily. Glycoberine-MX  otc supplement    [provider]  Omega-3 Fatty Acids (FISH OIL PO) Take 2,150 mg by mouth 2 (two) times daily.    [provider]  OVER THE COUNTER MEDICATION Hemagenics  1 tablet po daily with food    [provider]  OVER THE  COUNTER MEDICATION Take 12.5 mg by mouth daily. Iodine otc supplement    [provider]  Probiotic Product (PROBIOTIC DAILY PO) Take 1 capsule by mouth 2 (two) times daily.     [provider]  thyroid (ARMOUR) 120 MG tablet Take 60-120 mg by mouth See admin instructions. Take 120 mg on day 1 then 60 mg on day 2, alternating 120 and 60 mg every other day    [provider]  vitamin A 25000 UNIT capsule Take 25,000 Units by mouth daily.     [provider]  Vitamin D-Vitamin K (D3 + K2 DOTS PO) Take 1 tablet by mouth daily.    [provider]  vitamin E 400 UNIT capsule Take 400 Units by mouth daily.    [provider]    Family History Family History  Problem Relation Age of Onset  . Colon cancer Neg Hx   . Colon polyps Neg Hx   . Esophageal cancer Neg Hx   . Rectal cancer Neg Hx   . Stomach cancer Neg Hx     Social History Social History   Tobacco Use  . Smoking status: Former Smoker    Packs/day: 1.00    Years: 17.00    Pack years: 17.00    Types: Cigarettes    Last attempt to quit: 09/26/1983    Years since quitting: 35.0  . Smokeless tobacco: Never Used  Substance Use Topics  . Alcohol use: Yes    Comment: occasional  . Drug use: No     Allergies   Patient has no known allergies.   Review of Systems Review of Systems   Physical Exam Triage Vital Signs ED Triage Vitals  Enc Vitals Group     BP 09/17/18 1321 129/80     Pulse Rate 09/17/18 1321 61     Resp 09/17/18 1321 18     Temp 09/17/18 1321 97.8 F (36.6 C)     Temp Source 09/17/18 1321 Oral     SpO2 09/17/18 1321 97 %     Weight --      Height --      Head Circumference --      Peak Flow --      Pain Score 09/17/18 1322 0     Pain Loc --      Pain Edu? --      Excl. in [address]? --    No data found.  Updated Vital Signs BP 129/80 (BP Location: Left Arm)   Pulse 61   Temp 97.8 F (36.6 C) (Oral)  Resp 18   SpO2 97%   Visual Acuity Right  Eye Distance:   Left Eye Distance:   Bilateral Distance:    Right Eye Near:   Left Eye Near:    Bilateral Near:     Physical Exam Vitals signs and nursing note reviewed.  Constitutional:      General: She is not in acute distress.    Appearance: Normal appearance. She is not ill-appearing, toxic-appearing or diaphoretic.  HENT:     Head: Normocephalic and atraumatic.     Right Ear: There is impacted cerumen.     Left Ear: There is impacted cerumen.     Nose: Nose normal.  Eyes:     Conjunctiva/sclera: Conjunctivae normal.  Neck:     Musculoskeletal: Normal range of motion.  Pulmonary:     Effort: Pulmonary effort is normal.  Musculoskeletal: Normal range of motion.  Skin:    General: Skin is warm and dry.  Neurological:     Mental Status: She is alert.  Psychiatric:        Mood and Affect: Mood normal.      UC Treatments / Results  Labs (all labs ordered are listed, but only abnormal results are displayed) Labs Reviewed - No data to display  EKG None  Radiology No results found.  Procedures Procedures (including critical care time)  Medications Ordered in UC Medications - No data to display  Initial Impression / Assessment and Plan / UC Course  I have reviewed the triage vital signs and the nursing notes.  Pertinent labs & imaging results that were available during my care of the patient were reviewed by me and considered in my medical decision making (see chart for details).     Cerumen impaction- bilateral ear irrigation Pt tolerated well Symptoms improved.  Follow up as needed for continued or worsening symptoms  Final Clinical Impressions(s) / UC Diagnoses   Final diagnoses:  Bilateral impacted cerumen     Discharge Instructions     We removed the wax from your ears.  Follow up as needed for continued or worsening symptoms     ED Prescriptions    None     Controlled Substance Prescriptions [place] Controlled Substance Registry  consulted? Not Applicable   Orvan July, NP 09/17/18 1411

## 2018-09-17 NOTE — ED Triage Notes (Signed)
Pt c/o bilateral ear fullness with difficulty hearing as of today while at a luncheon

## 2018-10-07 ENCOUNTER — Ambulatory Visit (INDEPENDENT_AMBULATORY_CARE_PROVIDER_SITE_OTHER): Payer: Medicare Other | Admitting: Family Medicine

## 2018-10-07 DIAGNOSIS — Z23 Encounter for immunization: Secondary | ICD-10-CM

## 2018-10-07 NOTE — Progress Notes (Signed)
Pt received high dose flu injection

## 2018-10-07 NOTE — Addendum Note (Signed)
Addended by: Doran Clay on: 10/07/2018 02:13 PM   Modules accepted: Level of Service

## 2018-10-07 NOTE — Patient Instructions (Addendum)
Pt come for  High Dose flu injection

## 2018-11-12 DIAGNOSIS — Z1231 Encounter for screening mammogram for malignant neoplasm of breast: Secondary | ICD-10-CM | POA: Diagnosis not present

## 2019-01-22 DIAGNOSIS — E509 Vitamin A deficiency, unspecified: Secondary | ICD-10-CM | POA: Diagnosis not present

## 2019-01-22 DIAGNOSIS — E063 Autoimmune thyroiditis: Secondary | ICD-10-CM | POA: Diagnosis not present

## 2019-01-22 DIAGNOSIS — E039 Hypothyroidism, unspecified: Secondary | ICD-10-CM | POA: Diagnosis not present

## 2019-01-22 DIAGNOSIS — E8881 Metabolic syndrome: Secondary | ICD-10-CM | POA: Diagnosis not present

## 2019-01-22 DIAGNOSIS — B3789 Other sites of candidiasis: Secondary | ICD-10-CM | POA: Diagnosis not present

## 2019-01-22 DIAGNOSIS — E559 Vitamin D deficiency, unspecified: Secondary | ICD-10-CM | POA: Diagnosis not present

## 2019-01-22 DIAGNOSIS — E781 Pure hyperglyceridemia: Secondary | ICD-10-CM | POA: Diagnosis not present

## 2019-01-22 DIAGNOSIS — D649 Anemia, unspecified: Secondary | ICD-10-CM | POA: Diagnosis not present

## 2019-01-22 DIAGNOSIS — B3782 Candidal enteritis: Secondary | ICD-10-CM | POA: Diagnosis not present

## 2019-01-22 DIAGNOSIS — M792 Neuralgia and neuritis, unspecified: Secondary | ICD-10-CM | POA: Diagnosis not present

## 2019-01-22 DIAGNOSIS — E785 Hyperlipidemia, unspecified: Secondary | ICD-10-CM | POA: Diagnosis not present

## 2019-06-17 DIAGNOSIS — H838X3 Other specified diseases of inner ear, bilateral: Secondary | ICD-10-CM | POA: Diagnosis not present

## 2019-06-17 DIAGNOSIS — H903 Sensorineural hearing loss, bilateral: Secondary | ICD-10-CM | POA: Diagnosis not present

## 2019-06-17 DIAGNOSIS — H6123 Impacted cerumen, bilateral: Secondary | ICD-10-CM | POA: Diagnosis not present

## 2019-06-26 ENCOUNTER — Other Ambulatory Visit: Payer: Self-pay

## 2019-06-26 ENCOUNTER — Ambulatory Visit (INDEPENDENT_AMBULATORY_CARE_PROVIDER_SITE_OTHER): Payer: Medicare Other | Admitting: Family Medicine

## 2019-06-26 DIAGNOSIS — Z23 Encounter for immunization: Secondary | ICD-10-CM | POA: Diagnosis not present

## 2019-07-16 DIAGNOSIS — R197 Diarrhea, unspecified: Secondary | ICD-10-CM | POA: Diagnosis not present

## 2019-08-05 DIAGNOSIS — R197 Diarrhea, unspecified: Secondary | ICD-10-CM | POA: Diagnosis not present

## 2019-08-18 DIAGNOSIS — M792 Neuralgia and neuritis, unspecified: Secondary | ICD-10-CM | POA: Diagnosis not present

## 2019-08-18 DIAGNOSIS — E7889 Other lipoprotein metabolism disorders: Secondary | ICD-10-CM | POA: Diagnosis not present

## 2019-08-18 DIAGNOSIS — R7982 Elevated C-reactive protein (CRP): Secondary | ICD-10-CM | POA: Diagnosis not present

## 2019-08-18 DIAGNOSIS — E039 Hypothyroidism, unspecified: Secondary | ICD-10-CM | POA: Diagnosis not present

## 2019-08-18 DIAGNOSIS — E063 Autoimmune thyroiditis: Secondary | ICD-10-CM | POA: Diagnosis not present

## 2019-08-18 DIAGNOSIS — E8881 Metabolic syndrome: Secondary | ICD-10-CM | POA: Diagnosis not present

## 2019-08-18 DIAGNOSIS — B3782 Candidal enteritis: Secondary | ICD-10-CM | POA: Diagnosis not present

## 2019-08-18 DIAGNOSIS — E509 Vitamin A deficiency, unspecified: Secondary | ICD-10-CM | POA: Diagnosis not present

## 2019-08-18 DIAGNOSIS — E785 Hyperlipidemia, unspecified: Secondary | ICD-10-CM | POA: Diagnosis not present

## 2019-08-18 DIAGNOSIS — E781 Pure hyperglyceridemia: Secondary | ICD-10-CM | POA: Diagnosis not present

## 2019-08-18 DIAGNOSIS — E559 Vitamin D deficiency, unspecified: Secondary | ICD-10-CM | POA: Diagnosis not present

## 2019-08-18 DIAGNOSIS — D649 Anemia, unspecified: Secondary | ICD-10-CM | POA: Diagnosis not present

## 2019-08-19 DIAGNOSIS — H2513 Age-related nuclear cataract, bilateral: Secondary | ICD-10-CM | POA: Diagnosis not present

## 2019-08-19 DIAGNOSIS — H5213 Myopia, bilateral: Secondary | ICD-10-CM | POA: Diagnosis not present

## 2019-10-06 ENCOUNTER — Ambulatory Visit: Payer: Medicare Other

## 2019-10-15 ENCOUNTER — Ambulatory Visit: Payer: Medicare Other | Attending: Internal Medicine

## 2019-10-15 DIAGNOSIS — Z23 Encounter for immunization: Secondary | ICD-10-CM | POA: Insufficient documentation

## 2019-10-15 NOTE — Progress Notes (Signed)
   Covid-19 Vaccination Clinic  Name:  April [name]    MRN: [id number] DOB: 1950-10-14  10/15/2019  [name] was observed post Covid-19 immunization for 15 minutes without incidence. She was provided with Vaccine Information Sheet and instruction to access the V-Safe system.   [name] was instructed to call 911 with any severe reactions post vaccine: Marland Kitchen Difficulty breathing  . Swelling of your face and throat  . A fast heartbeat  . A bad rash all over your body  . Dizziness and weakness    Immunizations Administered    Name Date Dose VIS Date Route   Pfizer COVID-19 Vaccine 10/15/2019  8:50 AM 0.3 mL 08/21/2019 Intramuscular   Manufacturer: [place]   Lot: [id number]   [place]: [id number]

## 2019-10-23 ENCOUNTER — Ambulatory Visit: Payer: Medicare Other

## 2019-10-27 ENCOUNTER — Other Ambulatory Visit: Payer: Self-pay

## 2019-10-27 ENCOUNTER — Ambulatory Visit (INDEPENDENT_AMBULATORY_CARE_PROVIDER_SITE_OTHER): Payer: Medicare Other | Admitting: Emergency Medicine

## 2019-10-27 ENCOUNTER — Ambulatory Visit (INDEPENDENT_AMBULATORY_CARE_PROVIDER_SITE_OTHER): Payer: Medicare Other

## 2019-10-27 ENCOUNTER — Encounter: Payer: Self-pay | Admitting: Emergency Medicine

## 2019-10-27 VITALS — BP 117/72 | HR 68 | Temp 98.1°F | Resp 15 | Ht 64.0 in | Wt 161.0 lb

## 2019-10-27 DIAGNOSIS — M25572 Pain in left ankle and joints of left foot: Secondary | ICD-10-CM | POA: Diagnosis not present

## 2019-10-27 DIAGNOSIS — S93402A Sprain of unspecified ligament of left ankle, initial encounter: Secondary | ICD-10-CM

## 2019-10-27 DIAGNOSIS — M25472 Effusion, left ankle: Secondary | ICD-10-CM

## 2019-10-27 DIAGNOSIS — M7989 Other specified soft tissue disorders: Secondary | ICD-10-CM | POA: Diagnosis not present

## 2019-10-27 NOTE — Patient Instructions (Addendum)
If you have lab work done today you will be contacted with your lab results within the next 2 weeks.  If you have not heard from [place] then please contact us. The fastest way to get your results is to register for My Chart.   IF you received an x-ray today, you will receive an invoice from [facility]. Please contact [facility] at [phone] with questions or concerns regarding your invoice.   IF you received labwork today, you will receive an invoice from [facility]. Please contact [facility] at [phone] with questions or concerns regarding your invoice.   Our billing staff will not be able to assist you with questions regarding bills from these companies.  You will be contacted with the lab results as soon as they are available. The fastest way to get your results is to activate your My Chart account. Instructions are located on the last page of this paperwork. If you have not heard from [place] regarding the results in 2 weeks, please contact this office.     Ankle Sprain  An ankle sprain is a stretch or tear in one of the tough tissues (ligaments) that connect the bones in your ankle. An ankle sprain can happen when the ankle rolls outward (inversion sprain) or inward (eversion sprain). What are the causes? This condition is caused by rolling or twisting the ankle. What increases the risk? You are more likely to develop this condition if you play sports. What are the signs or symptoms? Symptoms of this condition include:  Pain in your ankle.  Swelling.  Bruising. This may happen right after you sprain your ankle or 1-2 days later.  Trouble standing or walking. How is this diagnosed? This condition is diagnosed with:  A physical exam. During the exam, your doctor will press on certain parts of your foot and ankle and try to move them in certain ways.  X-ray imaging. These may be taken to see how bad the sprain is and to check for broken bones. How is this  treated? This condition may be treated with:  A brace or splint. This is used to keep the ankle from moving until it heals.  An elastic bandage. This is used to support the ankle.  Crutches.  Pain medicine.  Surgery. This may be needed if the sprain is very bad.  Physical therapy. This may help to improve movement in the ankle. Follow these instructions at home: If you have a brace or a splint:  Wear the brace or splint as told by your doctor. Remove it only as told by your doctor.  Loosen the brace or splint if your toes: ? Tingle. ? Lose feeling (become numb). ? Turn cold and blue.  Keep the brace or splint clean.  If the brace or splint is not waterproof: ? Do not let it get wet. ? Cover it with a watertight covering when you take a bath or a shower. If you have an elastic bandage (dressing):  Remove it to shower or bathe.  Try not to move your ankle much, but wiggle your toes from time to time. This helps to prevent swelling.  Adjust the dressing if it feels too tight.  Loosen the dressing if your foot: ? Loses feeling. ? Tingles. ? Becomes cold and blue. Managing pain, stiffness, and swelling   Take over-the-counter and prescription medicines only as told by doctor.  For 2-3 days, keep your ankle raised (elevated) above the level of your heart.  If told, put ice on the injured area: ? If you have a removable brace or splint, remove it as told by your doctor. ? Put ice in a plastic bag. ? Place a towel between your skin and the bag. ? Leave the ice on for 20 minutes, 2-3 times a day. General instructions  Rest your ankle.  Do not use your injured leg to support your body weight until your doctor says that you can. Use crutches as told by your doctor.  Do not use any products that contain nicotine or tobacco, such as cigarettes, e-cigarettes, and chewing tobacco. If you need help quitting, ask your doctor.  Keep all follow-up visits as told by your  doctor. Contact a doctor if:  Your bruises or swelling are quickly getting worse.  Your pain does not get better after you take medicine. Get help right away if:  You cannot feel your toes or foot.  Your foot or toes look blue.  You have very bad pain that gets worse. Summary  An ankle sprain is a stretch or tear in one of the tough tissues (ligaments) that connect the bones in your ankle.  This condition is caused by rolling or twisting the ankle.  Symptoms include pain, swelling, bruising, and trouble walking.  To help with pain and swelling, put ice on the injured ankle, raise your ankle above the level of your heart, and use an elastic bandage. Also, rest as told by your doctor.  Keep all follow-up visits as told by your doctor. This is important. This information is not intended to replace advice given to you by your health care provider. Make sure you discuss any questions you have with your health care provider. Document Revised: 01/21/2018 Document Reviewed: 01/21/2018 Elsevier Patient Education  [place].

## 2019-10-27 NOTE — Progress Notes (Signed)
April [name] 69 y.o.   Chief Complaint  Patient presents with  . Edema    Lt ankle started aproximatly 4 days ago, pain started in the middle of the night while patient was in bed, pt states she walked home on monday last week approxiamatly 3 miles in bad shoes but can think of no toher contributer    HISTORY OF PRESENT ILLNESS: This is a 69 y.o. female complaining of left ankle swelling with mild pain that started 4 days ago.  Possible injury but not sure.  No other associated symptoms.  HPI   Prior to Admission medications   Medication Sig Start Date End Date Taking? Authorizing Provider  BETAINE PO Take 5 tablets by mouth 3 (three) times daily with meals.    Yes [provider]  Digestive Enzymes (DIGESTIVE ENZYME PO) Take 1 tablet by mouth 3 (three) times daily with meals.   Yes [provider]  liothyronine (CYTOMEL) 25 MCG tablet Take 12.5 mcg by mouth daily with lunch.   Yes [provider]  Magnesium 250 MG TABS Take 500 mg by mouth at bedtime.   Yes [provider]  Menaquinone-7 (VITAMIN K2 PO) Take 15,000 mcg by mouth daily.   Yes [provider]  Methylcobalamin 5000 MCG SUBL Place 5,000 mcg under the tongue daily.   Yes [provider]  Multiple Vitamins-Minerals (MULTIVITAMIN ADULT PO) Take 3 tablets by mouth daily.    Yes [provider]  NON FORMULARY Take 125 mg by mouth daily. Annatto tocotrienols  125 mg otc supplement   Yes [provider]  NON FORMULARY Take 1 Dose by mouth at bedtime. serrapeptase   250000 SPU   Yes [provider]  NON FORMULARY Take 1 tablet by mouth daily. Glycoberine-MX  otc supplement   Yes [provider]  Omega-3 Fatty Acids (FISH OIL PO) Take 2,150 mg by mouth 2 (two) times daily.   Yes [provider]  OVER THE COUNTER MEDICATION Hemagenics  1 tablet po daily with food   Yes [provider]  OVER THE COUNTER MEDICATION Take 12.5 mg  by mouth daily. Iodine otc supplement   Yes [provider]  Probiotic Product (PROBIOTIC DAILY PO) Take 1 capsule by mouth 2 (two) times daily.    Yes [provider]  thyroid (ARMOUR) 120 MG tablet Take 60-120 mg by mouth See admin instructions. Take 120 mg on day 1 then 60 mg on day 2, alternating 120 and 60 mg every other day   Yes [provider]  vitamin A 25000 UNIT capsule Take 25,000 Units by mouth daily.    Yes [provider]  Vitamin D-Vitamin K (D3 + K2 DOTS PO) Take 1 tablet by mouth daily.   Yes [provider]  vitamin E 400 UNIT capsule Take 400 Units by mouth daily.    [provider]    No Known Allergies  Patient Active Problem List   Diagnosis Date Noted  . Benign neoplasm of cecum   . Benign neoplasm of sigmoid colon 08/25/2012  . Hx of adenomatous colonic polyps 08/25/2012  . Hypothyroid 08/24/2011    Past Medical History:  Diagnosis Date  . Anemia    iron  . Bronchitis   . Cancer ([place])    thymoma   . Candidiasis of intestine   . Cataract    beginning stages   . Complication of anesthesia    pt states they said it took 3 attempts  to get tube down-small esophagus  . Fatigue   . GERD (gastroesophageal reflux disease)   . Hypercholesteremia   . Hypochlorhydria   . Hypothyroidism   . Insomnia   . Mineral deficiency   . Obesity   . Osteopenia   . Pneumonia   . Radiculopathy, lumbar region   . Recurrent upper respiratory infection (URI)    COLD IN JAN....STILL HAS OCCAS. COUGHING  . Sleep apnea    pt verbalize she no longer has sleep apnea and doesn't use cpap  . Vitamin D deficiency   . Vitamin D deficiency     Past Surgical History:  Procedure Laterality Date  . COLONOSCOPY  08/25/2012   Procedure: COLONOSCOPY;  Surgeon: Ladene Artist, MD,FACG;  Location: [facility];  Service: Endoscopy;  Laterality: N/A;  . COLONOSCOPY    . COLONOSCOPY WITH PROPOFOL N/A 02/10/2018   Procedure:  COLONOSCOPY WITH PROPOFOL;  Surgeon: Ladene Artist, MD;  Location: [facility];  Service: Endoscopy;  Laterality: N/A;  . GALLBLADDER SURGERY    . LAPAROSCOPIC CHOLECYSTECTOMY    . LUNG BIOPSY    . MEDIASTERNOTOMY  10/31/2011   Procedure: MEDIASTERNOTOMY;  Surgeon: Grace Isaac, MD;  Location: [facility];  Service: Thoracic;  Laterality: N/A;  RESECTION OF THYMOMA, FULL STERNOTOMY  . POLYPECTOMY    . POLYPECTOMY  02/10/2018   Procedure: POLYPECTOMY;  Surgeon: Ladene Artist, MD;  Location: [facility];  Service: Endoscopy;;  . stenotomy      STENOTOMY/THYMECTOMY  . TONSILLECTOMY      Social History   Socioeconomic History  . Marital status: Married    Spouse name: Not on file  . Number of children: Not on file  . Years of education: Not on file  . Highest education level: Not on file  Occupational History  . Not on file  Tobacco Use  . Smoking status: Former Smoker    Packs/day: 1.00    Years: 17.00    Pack years: 17.00    Types: Cigarettes    Quit date: 09/26/1983    Years since quitting: 36.1  . Smokeless tobacco: Never Used  Substance and Sexual Activity  . Alcohol use: Yes    Comment: occasional  . Drug use: No  . Sexual activity: Yes  Other Topics Concern  . Not on file  Social History Narrative  . Not on file   Social Determinants of Health   Financial Resource Strain:   . Difficulty of Paying Living Expenses: Not on file  Food Insecurity:   . Worried About Charity fundraiser in the Last Year: Not on file  . Ran Out of Food in the Last Year: Not on file  Transportation Needs:   . Lack of Transportation (Medical): Not on file  . Lack of Transportation (Non-Medical): Not on file  Physical Activity:   . Days of Exercise per Week: Not on file  . Minutes of Exercise per Session: Not on file  Stress:   . Feeling of Stress : Not on file  Social Connections:   . Frequency of Communication with Friends and Family: Not on file  . Frequency of Social  Gatherings with Friends and Family: Not on file  . Attends Religious Services: Not on file  . Active Member of Clubs or Organizations: Not on file  . Attends Archivist Meetings: Not on file  . Marital Status: Not on file  Intimate Partner Violence:   . Fear of Current or Ex-Partner: Not on  file  . Emotionally Abused: Not on file  . Physically Abused: Not on file  . Sexually Abused: Not on file    Family History  Problem Relation Age of Onset  . Colon cancer Neg Hx   . Colon polyps Neg Hx   . Esophageal cancer Neg Hx   . Rectal cancer Neg Hx   . Stomach cancer Neg Hx      Review of Systems  Constitutional: Negative.  Negative for chills and fever.  HENT: Negative.  Negative for congestion and sore throat.   Respiratory: Negative.  Negative for cough and shortness of breath.   Cardiovascular: Negative.  Negative for chest pain and palpitations.  Gastrointestinal: Negative.  Negative for abdominal pain, diarrhea, nausea and vomiting.  Musculoskeletal: Positive for joint pain (Left ankle).  Skin: Negative.  Negative for rash.  Neurological: Negative.  Negative for dizziness and headaches.  All other systems reviewed and are negative.  Today's Vitals   10/27/19 1406  BP: 117/72  Pulse: 68  Resp: 15  Temp: 98.1 F (36.7 C)  TempSrc: Temporal  SpO2: 97%  Weight: 161 lb (73 kg)  Height: 5\' 4"  (1.626 m)  PainSc: 0-No pain   Body mass index is 27.64 kg/m.   Physical Exam Vitals reviewed.  Constitutional:      Appearance: Normal appearance.  HENT:     Head: Normocephalic.  Eyes:     Extraocular Movements: Extraocular movements intact.     Pupils: Pupils are equal, round, and reactive to light.  Cardiovascular:     Rate and Rhythm: Normal rate.  Pulmonary:     Effort: Pulmonary effort is normal.  Musculoskeletal:     Cervical back: Normal range of motion.     Comments: Left ankle: No erythema or bruising.  Positive lateral swelling.  No tenderness.   Full range of motion. Left foot: Neurovascularly intact, within normal limits. Right ankle and foot: Within normal limits.  No swelling or tenderness with full range of motion.  Skin:    General: Skin is warm and dry.     Capillary Refill: Capillary refill takes less than 2 seconds.  Neurological:     General: No focal deficit present.     Mental Status: She is alert and oriented to person, place, and time.     DG Ankle Complete Left  Result Date: 10/27/2019 CLINICAL DATA:  Left ankle pain and swelling, no known injury, initial encounter EXAM: LEFT ANKLE COMPLETE - 3+ VIEW COMPARISON:  None. FINDINGS: Mild soft tissue swelling is noted laterally. No acute fracture or dislocation is noted. Small calcaneal spurs are seen. IMPRESSION: Soft tissue swelling without acute bony abnormality. Electronically Signed   By: Inez Catalina M.D.   On: 10/27/2019 14:49    ASSESSMENT & PLAN: Annalyse was seen today for edema.  Diagnoses and all orders for this visit:  Left ankle swelling -     DG Ankle Complete Left; Future  Sprain of left ankle, unspecified ligament, initial encounter    Patient Instructions       If you have lab work done today you will be contacted with your lab results within the next 2 weeks.  If you have not heard from [place] then please contact us. The fastest way to get your results is to register for My Chart.   IF you received an x-ray today, you will receive an invoice from [facility]. Please contact [facility] at [phone] with questions or concerns regarding your invoice.  IF you received labwork today, you will receive an invoice from [facility]. Please contact [facility] at [phone] with questions or concerns regarding your invoice.   Our billing staff will not be able to assist you with questions regarding bills from these companies.  You will be contacted with the lab results as soon as they are available. The fastest way to get your results  is to activate your My Chart account. Instructions are located on the last page of this paperwork. If you have not heard from [place] regarding the results in 2 weeks, please contact this office.     Ankle Sprain  An ankle sprain is a stretch or tear in one of the tough tissues (ligaments) that connect the bones in your ankle. An ankle sprain can happen when the ankle rolls outward (inversion sprain) or inward (eversion sprain). What are the causes? This condition is caused by rolling or twisting the ankle. What increases the risk? You are more likely to develop this condition if you play sports. What are the signs or symptoms? Symptoms of this condition include:  Pain in your ankle.  Swelling.  Bruising. This may happen right after you sprain your ankle or 1-2 days later.  Trouble standing or walking. How is this diagnosed? This condition is diagnosed with:  A physical exam. During the exam, your doctor will press on certain parts of your foot and ankle and try to move them in certain ways.  X-ray imaging. These may be taken to see how bad the sprain is and to check for broken bones. How is this treated? This condition may be treated with:  A brace or splint. This is used to keep the ankle from moving until it heals.  An elastic bandage. This is used to support the ankle.  Crutches.  Pain medicine.  Surgery. This may be needed if the sprain is very bad.  Physical therapy. This may help to improve movement in the ankle. Follow these instructions at home: If you have a brace or a splint:  Wear the brace or splint as told by your doctor. Remove it only as told by your doctor.  Loosen the brace or splint if your toes: ? Tingle. ? Lose feeling (become numb). ? Turn cold and blue.  Keep the brace or splint clean.  If the brace or splint is not waterproof: ? Do not let it get wet. ? Cover it with a watertight covering when you take a bath or a shower. If you have an  elastic bandage (dressing):  Remove it to shower or bathe.  Try not to move your ankle much, but wiggle your toes from time to time. This helps to prevent swelling.  Adjust the dressing if it feels too tight.  Loosen the dressing if your foot: ? Loses feeling. ? Tingles. ? Becomes cold and blue. Managing pain, stiffness, and swelling   Take over-the-counter and prescription medicines only as told by doctor.  For 2-3 days, keep your ankle raised (elevated) above the level of your heart.  If told, put ice on the injured area: ? If you have a removable brace or splint, remove it as told by your doctor. ? Put ice in a plastic bag. ? Place a towel between your skin and the bag. ? Leave the ice on for 20 minutes, 2-3 times a day. General instructions  Rest your ankle.  Do not use your injured leg to support your body weight until your doctor says that you can.  Use crutches as told by your doctor.  Do not use any products that contain nicotine or tobacco, such as cigarettes, e-cigarettes, and chewing tobacco. If you need help quitting, ask your doctor.  Keep all follow-up visits as told by your doctor. Contact a doctor if:  Your bruises or swelling are quickly getting worse.  Your pain does not get better after you take medicine. Get help right away if:  You cannot feel your toes or foot.  Your foot or toes look blue.  You have very bad pain that gets worse. Summary  An ankle sprain is a stretch or tear in one of the tough tissues (ligaments) that connect the bones in your ankle.  This condition is caused by rolling or twisting the ankle.  Symptoms include pain, swelling, bruising, and trouble walking.  To help with pain and swelling, put ice on the injured ankle, raise your ankle above the level of your heart, and use an elastic bandage. Also, rest as told by your doctor.  Keep all follow-up visits as told by your doctor. This is important. This information is not  intended to replace advice given to you by your health care provider. Make sure you discuss any questions you have with your health care provider. Document Revised: 01/21/2018 Document Reviewed: 01/21/2018 Elsevier Patient Education  2020 Elsevier Inc.      Agustina Caroli, MD [facility]

## 2019-11-09 ENCOUNTER — Ambulatory Visit: Payer: Medicare Other | Attending: Internal Medicine

## 2019-11-09 DIAGNOSIS — Z23 Encounter for immunization: Secondary | ICD-10-CM | POA: Insufficient documentation

## 2019-11-09 NOTE — Progress Notes (Signed)
   Covid-19 Vaccination Clinic  Name:  April [name]    MRN: [id number] DOB: 08-31-1951  11/09/2019  [name] was observed post Covid-19 immunization for 15 minutes without incidence. She was provided with Vaccine Information Sheet and instruction to access the V-Safe system.   [name] was instructed to call 911 with any severe reactions post vaccine: Marland Kitchen Difficulty breathing  . Swelling of your face and throat  . A fast heartbeat  . A bad rash all over your body  . Dizziness and weakness    Immunizations Administered    Name Date Dose VIS Date Route   Pfizer COVID-19 Vaccine 11/09/2019  1:29 PM 0.3 mL 08/21/2019 Intramuscular   Manufacturer: [place]   Lot: [id number]   [place]: [id number]

## 2020-01-01 DIAGNOSIS — H6123 Impacted cerumen, bilateral: Secondary | ICD-10-CM | POA: Diagnosis not present

## 2020-01-15 ENCOUNTER — Other Ambulatory Visit: Payer: Self-pay | Admitting: Family Medicine

## 2020-01-15 DIAGNOSIS — E782 Mixed hyperlipidemia: Secondary | ICD-10-CM

## 2020-02-01 DIAGNOSIS — Z01419 Encounter for gynecological examination (general) (routine) without abnormal findings: Secondary | ICD-10-CM | POA: Diagnosis not present

## 2020-02-01 DIAGNOSIS — Z1231 Encounter for screening mammogram for malignant neoplasm of breast: Secondary | ICD-10-CM | POA: Diagnosis not present

## 2020-02-02 ENCOUNTER — Ambulatory Visit: Payer: Medicare Other | Admitting: Registered Nurse

## 2020-02-02 VITALS — BP 117/72 | Ht 64.0 in | Wt 161.0 lb

## 2020-02-02 DIAGNOSIS — Z0000 Encounter for general adult medical examination without abnormal findings: Secondary | ICD-10-CM

## 2020-02-02 NOTE — Progress Notes (Signed)
Presents today for [employer] Visit   Date of last exam: 10/27/2019  Interpreter used for this visit? No  I connected with  April [name] on 02/02/20 by a telephone and verified that I am speaking with the correct person using two identifiers.   I discussed the limitations of evaluation and management by telemedicine. The patient expressed understanding and agreed to proceed.     Patient Care Team: [name], MD as PCP - General (Family Medicine)   Other items to address today:   Discussed Eye/Dental Discussed immunizatios     ADVANCE DIRECTIVES: Discussed: yes On File: no Materials Provided: no  Immunization status:  Immunization History  Administered Date(s) Administered  . Fluad Quad(high Dose 65+) 06/26/2019  . Influenza, High Dose Seasonal PF 10/07/2018  . Influenza,inj,Quad PF,6+ Mos 09/05/2016, 07/10/2017  . PFIZER SARS-COV-2 Vaccination 10/15/2019, 11/09/2019  . Pneumococcal Conjugate-13 08/27/2015  . Pneumococcal Polysaccharide-23 09/05/2016  . Tdap 08/18/2015     Health Maintenance Due  Topic Date Due  . Hepatitis C Screening  Never done     Functional Status Survey: Is the patient deaf or have difficulty hearing?: No Does the patient have difficulty seeing, even when wearing glasses/contacts?: No Does the patient have difficulty concentrating, remembering, or making decisions?: No Does the patient have difficulty walking or climbing stairs?: No Does the patient have difficulty dressing or bathing?: No Does the patient have difficulty doing errands alone such as visiting a doctor's office or shopping?: No   6CIT Screen 02/02/2020  What Year? 0 points  What month? 0 points  What time? 0 points  Count back from 20 0 points  Months in reverse 2 points  Repeat phrase 0 points  Total Score 2        Clinical Support from 02/02/2020 in [facility] [place]  AUDIT-C Score  2       Home Environment:    Lives in a  two story home No trouble climbing stairs No scattered rugs No grab bars Adequate lighting/ no clutter   Patient Active Problem List   Diagnosis Date Noted  . Benign neoplasm of cecum   . Benign neoplasm of sigmoid colon 08/25/2012  . Hx of adenomatous colonic polyps 08/25/2012  . Hypothyroid 08/24/2011     Past Medical History:  Diagnosis Date  . Anemia    iron  . Bronchitis   . Cancer ([place])    thymoma   . Candidiasis of intestine   . Cataract    beginning stages   . Complication of anesthesia    pt states they said it took 3 attempts to get tube down-small esophagus  . Fatigue   . GERD (gastroesophageal reflux disease)   . Hypercholesteremia   . Hypochlorhydria   . Hypothyroidism   . Insomnia   . Mineral deficiency   . Obesity   . Osteopenia   . Pneumonia   . Radiculopathy, lumbar region   . Recurrent upper respiratory infection (URI)    COLD IN JAN....STILL HAS OCCAS. COUGHING  . Sleep apnea    pt verbalize she no longer has sleep apnea and doesn't use cpap  . Vitamin D deficiency   . Vitamin D deficiency      Past Surgical History:  Procedure Laterality Date  . COLONOSCOPY  08/25/2012   Procedure: COLONOSCOPY;  Surgeon: Ladene Artist, MD,FACG;  [facility]: [facility];  Service: Endoscopy;  Laterality: N/A;  . COLONOSCOPY    . COLONOSCOPY WITH  PROPOFOL N/A 02/10/2018   Procedure: COLONOSCOPY WITH PROPOFOL;  Surgeon: Ladene Artist, MD;  [facility];  Service: Endoscopy;  Laterality: N/A;  . GALLBLADDER SURGERY    . LAPAROSCOPIC CHOLECYSTECTOMY    . LUNG BIOPSY    . MEDIASTERNOTOMY  10/31/2011   Procedure: MEDIASTERNOTOMY;  Surgeon: Grace Isaac, MD;  Location: [facility];  Service: Thoracic;  Laterality: N/A;  RESECTION OF THYMOMA, FULL STERNOTOMY  . POLYPECTOMY    . POLYPECTOMY  02/10/2018   Procedure: POLYPECTOMY;  Surgeon: Ladene Artist, MD;  Location: [facility];  Service: Endoscopy;;  . stenotomy      STENOTOMY/THYMECTOMY  .  TONSILLECTOMY       Family History  Problem Relation Age of Onset  . Colon cancer Neg Hx   . Colon polyps Neg Hx   . Esophageal cancer Neg Hx   . Rectal cancer Neg Hx   . Stomach cancer Neg Hx      Social History   Socioeconomic History  . Marital status: Married    Spouse name: Not on file  . Number of children: Not on file  . Years of education: Not on file  . Highest education level: Not on file  Occupational History  . Not on file  Tobacco Use  . Smoking status: Former Smoker    Packs/day: 1.00    Years: 17.00    Pack years: 17.00    Types: Cigarettes    Quit date: 09/26/1983    Years since quitting: 36.3  . Smokeless tobacco: Never Used  Substance and Sexual Activity  . Alcohol use: Yes    Comment: occasional  . Drug use: No  . Sexual activity: Yes  Other Topics Concern  . Not on file  Social History Narrative  . Not on file   Social Determinants of Health   Financial Resource Strain:   . Difficulty of Paying Living Expenses:   Food Insecurity:   . Worried About Charity fundraiser in the Last Year:   . Arboriculturist in the Last Year:   Transportation Needs:   . Film/video editor (Medical):   Marland Kitchen Lack of Transportation (Non-Medical):   Physical Activity:   . Days of Exercise per Week:   . Minutes of Exercise per Session:   Stress:   . Feeling of Stress :   Social Connections:   . Frequency of Communication with Friends and Family:   . Frequency of Social Gatherings with Friends and Family:   . Attends Religious Services:   . Active Member of Clubs or Organizations:   . Attends Archivist Meetings:   Marland Kitchen Marital Status:   Intimate Partner Violence:   . Fear of Current or Ex-Partner:   . Emotionally Abused:   Marland Kitchen Physically Abused:   . Sexually Abused:      No Known Allergies   Prior to Admission medications   Medication Sig Start Date End Date Taking? Authorizing Provider  BETAINE PO Take 5 tablets by mouth 3 (three) times  daily with meals.    Yes [provider]  Digestive Enzymes (DIGESTIVE ENZYME PO) Take 1 tablet by mouth 3 (three) times daily with meals.   Yes [provider]  Magnesium 250 MG TABS Take 500 mg by mouth at bedtime.   Yes [provider]  Menaquinone-7 (VITAMIN K2 PO) Take 15,000 mcg by mouth daily.   Yes [provider]  Methylcobalamin 5000 MCG SUBL Place 5,000 mcg under  the tongue daily.   Yes [provider]  Multiple Vitamins-Minerals (MULTIVITAMIN ADULT PO) Take 3 tablets by mouth daily.    Yes [provider]  NON FORMULARY Take 1 tablet by mouth daily. Glycoberine-MX  otc supplement   Yes [provider]  Omega-3 Fatty Acids (FISH OIL PO) Take 2,150 mg by mouth 2 (two) times daily.   Yes [provider]  OVER THE COUNTER MEDICATION Hemagenics  1 tablet po daily with food   Yes [provider]  OVER THE COUNTER MEDICATION Take 12.5 mg by mouth daily. Iodine otc supplement   Yes [provider]  thyroid (ARMOUR) 120 MG tablet Take 60-120 mg by mouth See admin instructions. Take 120 mg on day 1 then 60 mg on day 2, alternating 120 and 60 mg every other day   Yes [provider]  Vitamin D-Vitamin K (D3 + K2 DOTS PO) Take 1 tablet by mouth daily.   Yes [provider]  liothyronine (CYTOMEL) 25 MCG tablet Take 12.5 mcg by mouth daily with lunch.    [provider]  NON FORMULARY Take 125 mg by mouth daily. Annatto tocotrienols  125 mg otc supplement    [provider]  NON FORMULARY Take 1 Dose by mouth at bedtime. serrapeptase   250000 SPU    [provider]     Depression screen [facility] 2/9 02/02/2020 10/27/2019 02/14/2018 11/21/2017 04/25/2017  Decreased Interest 0 0 0 0 0  Down, Depressed, Hopeless 0 0 0 0 0  PHQ - 2 Score 0 0 0 0 0     Fall Risk  02/02/2020 10/27/2019 02/14/2018 11/21/2017 04/25/2017  Falls in the past year? 0 0 No No No  Number falls in past yr:  0 - - - -  Injury with Fall? 0 - - - -  Follow up Falls evaluation completed;Education provided Falls evaluation completed - - -      PHYSICAL EXAM: BP 117/72 Comment: taken from a previous visit  Ht 5\' 4"  (1.626 m)   Wt 161 lb (73 kg)   BMI 27.64 kg/m    Wt Readings from Last 3 Encounters:  02/02/20 161 lb (73 kg)  10/27/19 161 lb (73 kg)  02/14/18 161 lb (73 kg)       Education/Counseling provided regarding diet and exercise, prevention of chronic diseases, smoking/tobacco cessation, if applicable, and reviewed "Covered Medicare Preventive Services."

## 2020-02-02 NOTE — Patient Instructions (Addendum)
Thank you for taking time to come for your Medicare Wellness Visit. I appreciate your ongoing commitment to your health goals. Please review the following plan we discussed and let me know if I can assist you in the future.  April Dingus LPN  Preventive Care 71 Years and Older, Female Preventive care refers to lifestyle choices and visits with your health care provider that can promote health and wellness. This includes:  A yearly physical exam. This is also called an annual well check.  Regular dental and eye exams.  Immunizations.  Screening for certain conditions.  Healthy lifestyle choices, such as diet and exercise. What can I expect for my preventive care visit? Physical exam Your health care provider will check:  Height and weight. These may be used to calculate body mass index (BMI), which is a measurement that tells if you are at a healthy weight.  Heart rate and blood pressure.  Your skin for abnormal spots. Counseling Your health care provider may ask you questions about:  Alcohol, tobacco, and drug use.  Emotional well-being.  Home and relationship well-being.  Sexual activity.  Eating habits.  History of falls.  Memory and ability to understand (cognition).  Work and work environment.  Pregnancy and menstrual history. What immunizations do I need?  Influenza (flu) vaccine  This is recommended every year. Tetanus, diphtheria, and pertussis (Tdap) vaccine  You may need a Td booster every 10 years. Varicella (chickenpox) vaccine  You may need this vaccine if you have not already been vaccinated. Zoster (shingles) vaccine  You may need this after age 60. Pneumococcal conjugate (PCV13) vaccine  One dose is recommended after age 71. Pneumococcal polysaccharide (PPSV23) vaccine  One dose is recommended after age 71. Measles, mumps, and rubella (MMR) vaccine  You may need at least one dose of MMR if you were born in 1957 or later. You may also  need a second dose. Meningococcal conjugate (MenACWY) vaccine  You may need this if you have certain conditions. Hepatitis A vaccine  You may need this if you have certain conditions or if you travel or work in places where you may be exposed to hepatitis A. Hepatitis B vaccine  You may need this if you have certain conditions or if you travel or work in places where you may be exposed to hepatitis B. Haemophilus influenzae type b (Hib) vaccine  You may need this if you have certain conditions. You may receive vaccines as individual doses or as more than one vaccine together in one shot (combination vaccines). Talk with your health care provider about the risks and benefits of combination vaccines. What tests do I need? Blood tests  Lipid and cholesterol levels. These may be checked every 5 years, or more frequently depending on your overall health.  Hepatitis C test.  Hepatitis B test. Screening  Lung cancer screening. You may have this screening every year starting at age 55 if you have a 30-pack-year history of smoking and currently smoke or have quit within the past 15 years.  Colorectal cancer screening. All adults should have this screening starting at age 50 and continuing until age 75. Your health care provider may recommend screening at age 45 if you are at increased risk. You will have tests every 1-10 years, depending on your results and the type of screening test.  Diabetes screening. This is done by checking your blood sugar (glucose) after you have not eaten for a while (fasting). You may have this done every 1-3   years.  Mammogram. This may be done every 1-2 years. Talk with your health care provider about how often you should have regular mammograms.  BRCA-related cancer screening. This may be done if you have a family history of breast, ovarian, tubal, or peritoneal cancers. Other tests  Sexually transmitted disease (STD) testing.  Bone density scan. This is done  to screen for osteoporosis. You may have this done starting at age 71. Follow these instructions at home: Eating and drinking  Eat a diet that includes fresh fruits and vegetables, whole grains, lean protein, and low-fat dairy products. Limit your intake of foods with high amounts of sugar, saturated fats, and salt.  Take vitamin and mineral supplements as recommended by your health care provider.  Do not drink alcohol if your health care provider tells you not to drink.  If you drink alcohol: ? Limit how much you have to 0-1 drink a day. ? Be aware of how much alcohol is in your drink. In the [place] one drink equals one 12 oz bottle of beer (355 mL), one 5 oz glass of wine (148 mL), or one 1 oz glass of hard liquor (44 mL). Lifestyle  Take daily care of your teeth and gums.  Stay active. Exercise for at least 30 minutes on 5 or more days each week.  Do not use any products that contain nicotine or tobacco, such as cigarettes, e-cigarettes, and chewing tobacco. If you need help quitting, ask your health care provider.  If you are sexually active, practice safe sex. Use a condom or other form of protection in order to prevent STIs (sexually transmitted infections).  Talk with your health care provider about taking a low-dose aspirin or statin. What's next?  Go to your health care provider once a year for a well check visit.  Ask your health care provider how often you should have your eyes and teeth checked.  Stay up to date on all vaccines. This information is not intended to replace advice given to you by your health care provider. Make sure you discuss any questions you have with your health care provider. Document Revised: 08/21/2018 Document Reviewed: 08/21/2018 Elsevier Patient Education  2020 [employer].

## 2020-05-13 DIAGNOSIS — E785 Hyperlipidemia, unspecified: Secondary | ICD-10-CM | POA: Diagnosis not present

## 2020-05-13 DIAGNOSIS — R7301 Impaired fasting glucose: Secondary | ICD-10-CM | POA: Diagnosis not present

## 2020-05-13 DIAGNOSIS — E8881 Metabolic syndrome: Secondary | ICD-10-CM | POA: Diagnosis not present

## 2020-05-13 DIAGNOSIS — E039 Hypothyroidism, unspecified: Secondary | ICD-10-CM | POA: Diagnosis not present

## 2020-05-13 DIAGNOSIS — B349 Viral infection, unspecified: Secondary | ICD-10-CM | POA: Diagnosis not present

## 2020-05-13 DIAGNOSIS — E559 Vitamin D deficiency, unspecified: Secondary | ICD-10-CM | POA: Diagnosis not present

## 2020-05-13 DIAGNOSIS — R5383 Other fatigue: Secondary | ICD-10-CM | POA: Diagnosis not present

## 2020-06-08 DIAGNOSIS — Z23 Encounter for immunization: Secondary | ICD-10-CM | POA: Diagnosis not present

## 2020-06-20 ENCOUNTER — Ambulatory Visit: Payer: Medicare Other

## 2020-06-20 DIAGNOSIS — Z23 Encounter for immunization: Secondary | ICD-10-CM | POA: Diagnosis not present

## 2020-06-29 ENCOUNTER — Ambulatory Visit
Admission: RE | Admit: 2020-06-29 | Discharge: 2020-06-29 | Disposition: A | Discharge status: Home / Self Care | Payer: Medicare Other | Source: Ambulatory Visit | Attending: Family Medicine | Admitting: Family Medicine

## 2020-06-29 DIAGNOSIS — E782 Mixed hyperlipidemia: Secondary | ICD-10-CM

## 2020-08-19 DIAGNOSIS — H5213 Myopia, bilateral: Secondary | ICD-10-CM | POA: Diagnosis not present

## 2020-08-19 DIAGNOSIS — H2513 Age-related nuclear cataract, bilateral: Secondary | ICD-10-CM | POA: Diagnosis not present

## 2020-08-19 DIAGNOSIS — H353131 Nonexudative age-related macular degeneration, bilateral, early dry stage: Secondary | ICD-10-CM | POA: Diagnosis not present

## 2020-09-13 DIAGNOSIS — H6123 Impacted cerumen, bilateral: Secondary | ICD-10-CM | POA: Diagnosis not present

## 2020-09-13 DIAGNOSIS — H838X3 Other specified diseases of inner ear, bilateral: Secondary | ICD-10-CM | POA: Diagnosis not present

## 2020-09-13 DIAGNOSIS — H903 Sensorineural hearing loss, bilateral: Secondary | ICD-10-CM | POA: Diagnosis not present

## 2020-12-13 DIAGNOSIS — Z23 Encounter for immunization: Secondary | ICD-10-CM | POA: Diagnosis not present

## 2020-12-15 ENCOUNTER — Ambulatory Visit: Payer: No Typology Code available for payment source | Admitting: Physician Assistant

## 2021-03-09 DIAGNOSIS — H3530 Unspecified macular degeneration: Secondary | ICD-10-CM | POA: Diagnosis not present

## 2021-03-09 DIAGNOSIS — E063 Autoimmune thyroiditis: Secondary | ICD-10-CM | POA: Diagnosis not present

## 2021-03-09 DIAGNOSIS — E8881 Metabolic syndrome: Secondary | ICD-10-CM | POA: Diagnosis not present

## 2021-03-09 DIAGNOSIS — E559 Vitamin D deficiency, unspecified: Secondary | ICD-10-CM | POA: Diagnosis not present

## 2021-03-09 DIAGNOSIS — E039 Hypothyroidism, unspecified: Secondary | ICD-10-CM | POA: Diagnosis not present

## 2021-03-09 DIAGNOSIS — R197 Diarrhea, unspecified: Secondary | ICD-10-CM | POA: Diagnosis not present

## 2021-03-09 DIAGNOSIS — E7889 Other lipoprotein metabolism disorders: Secondary | ICD-10-CM | POA: Diagnosis not present

## 2021-03-09 DIAGNOSIS — R7982 Elevated C-reactive protein (CRP): Secondary | ICD-10-CM | POA: Diagnosis not present

## 2021-03-09 DIAGNOSIS — E782 Mixed hyperlipidemia: Secondary | ICD-10-CM | POA: Diagnosis not present

## 2021-03-09 DIAGNOSIS — B3782 Candidal enteritis: Secondary | ICD-10-CM | POA: Diagnosis not present

## 2021-03-09 DIAGNOSIS — E785 Hyperlipidemia, unspecified: Secondary | ICD-10-CM | POA: Diagnosis not present

## 2021-03-28 ENCOUNTER — Other Ambulatory Visit: Payer: Self-pay | Admitting: Family Medicine

## 2021-03-28 DIAGNOSIS — Q782 Osteopetrosis: Secondary | ICD-10-CM

## 2021-03-28 DIAGNOSIS — E2839 Other primary ovarian failure: Secondary | ICD-10-CM

## 2021-04-11 DIAGNOSIS — H6123 Impacted cerumen, bilateral: Secondary | ICD-10-CM | POA: Diagnosis not present

## 2021-04-11 DIAGNOSIS — H838X3 Other specified diseases of inner ear, bilateral: Secondary | ICD-10-CM | POA: Diagnosis not present

## 2021-04-11 DIAGNOSIS — H903 Sensorineural hearing loss, bilateral: Secondary | ICD-10-CM | POA: Diagnosis not present

## 2021-05-30 DIAGNOSIS — Z23 Encounter for immunization: Secondary | ICD-10-CM | POA: Diagnosis not present

## 2021-08-10 ENCOUNTER — Ambulatory Visit (INDEPENDENT_AMBULATORY_CARE_PROVIDER_SITE_OTHER): Payer: Medicare Other | Admitting: Dermatology

## 2021-08-10 ENCOUNTER — Other Ambulatory Visit: Payer: Self-pay

## 2021-08-10 ENCOUNTER — Encounter: Payer: Self-pay | Admitting: Dermatology

## 2021-08-10 DIAGNOSIS — L719 Rosacea, unspecified: Secondary | ICD-10-CM

## 2021-08-10 DIAGNOSIS — B079 Viral wart, unspecified: Secondary | ICD-10-CM | POA: Diagnosis not present

## 2021-08-10 DIAGNOSIS — D485 Neoplasm of uncertain behavior of skin: Secondary | ICD-10-CM

## 2021-08-10 MED ORDER — METRONIDAZOLE 0.75 % EX GEL: 1.0000 "application " | Freq: Every day | CUTANEOUS | 1 refills | Status: AC

## 2021-08-10 NOTE — Patient Instructions (Signed)

## 2021-08-22 DIAGNOSIS — H2513 Age-related nuclear cataract, bilateral: Secondary | ICD-10-CM | POA: Diagnosis not present

## 2021-08-22 DIAGNOSIS — H353131 Nonexudative age-related macular degeneration, bilateral, early dry stage: Secondary | ICD-10-CM | POA: Diagnosis not present

## 2021-08-22 DIAGNOSIS — H5213 Myopia, bilateral: Secondary | ICD-10-CM | POA: Diagnosis not present

## 2021-08-29 ENCOUNTER — Encounter: Payer: Self-pay | Admitting: Dermatology

## 2021-08-29 NOTE — Progress Notes (Signed)
° °  Follow-Up Visit   Subjective  Emmalise Huard is a 70 y.o. female who presents for the following: Skin Problem (Right cheek patient stated it was a wart previously biopsy she has had it for years  ).  Regrowth on cheek and discuss options for rosacea Location:  Duration:  Quality:  Associated Signs/Symptoms: Modifying Factors:  Severity:  Timing: Context:   Objective  Well appearing patient in no apparent distress; mood and affect are within normal limits. Head - Anterior (Face) Central facial erythema with a history of some inflammatory lesions.   [facility] - Rx #: [id number] Need help? Call us at [phone] Outcome Additional Information Required The patient currently has access to the requested medication and a Prior Authorization is not needed for the patient/medication. Drug metroNIDAZOLE 0.75% gel Form [employer] Electronic PA Form [phone] NCPDP) Original Claim Info [id number] (PHARMACY HELP DESK [phone])  Right Buccal Cheek 2.5 cm to nose junction; verrucous 60mm papule       A focused examination was performed including head, neck, back.. Relevant physical exam findings are noted in the Assessment and Plan.   Assessment & Plan    Rosacea Head - Anterior (Face)  Okay refills on topical metronidazole.  To contact me if this does not control the process.  metroNIDAZOLE (METROGEL) 0.75 % gel - Head - Anterior (Face) Apply 1 application topically daily.  Neoplasm of uncertain behavior of skin Right Buccal Cheek  Skin / nail biopsy Type of biopsy: tangential   Informed consent: discussed and consent obtained   Timeout: patient name, date of birth, surgical site, and procedure verified   Anesthesia: the lesion was anesthetized in a standard fashion   Anesthetic:  1% lidocaine w/ epinephrine 1-100,000 local infiltration Instrument used: flexible razor blade   Hemostasis achieved with: aluminum chloride and electrodesiccation    Outcome: patient tolerated procedure well   Post-procedure details: wound care instructions given   Additional details:  Base lightly cauterized after shave biopsy.  Specimen 1 - Surgical pathology Differential Diagnosis: wart tx with bx [id number]   Check Margins: No      I, Lavonna Monarch, MD, have reviewed all documentation for this visit.  The documentation on 08/29/21 for the exam, diagnosis, procedures, and orders are all accurate and complete.

## 2021-09-20 ENCOUNTER — Ambulatory Visit
Admission: RE | Admit: 2021-09-20 | Discharge: 2021-09-20 | Disposition: A | Discharge status: Home / Self Care | Payer: Medicare Other | Source: Ambulatory Visit | Attending: Family Medicine | Admitting: Family Medicine

## 2021-09-20 DIAGNOSIS — Z780 Asymptomatic menopausal state: Secondary | ICD-10-CM | POA: Diagnosis not present

## 2021-09-20 DIAGNOSIS — Q782 Osteopetrosis: Secondary | ICD-10-CM

## 2021-09-20 DIAGNOSIS — M8589 Other specified disorders of bone density and structure, multiple sites: Secondary | ICD-10-CM | POA: Diagnosis not present

## 2021-09-20 DIAGNOSIS — E2839 Other primary ovarian failure: Secondary | ICD-10-CM

## 2021-10-11 ENCOUNTER — Ambulatory Visit: Payer: No Typology Code available for payment source | Admitting: Dermatology

## 2021-10-18 DIAGNOSIS — H6123 Impacted cerumen, bilateral: Secondary | ICD-10-CM | POA: Diagnosis not present

## 2021-11-06 DIAGNOSIS — H903 Sensorineural hearing loss, bilateral: Secondary | ICD-10-CM | POA: Diagnosis not present

## 2021-11-06 DIAGNOSIS — H6121 Impacted cerumen, right ear: Secondary | ICD-10-CM | POA: Diagnosis not present

## 2021-11-06 DIAGNOSIS — H838X3 Other specified diseases of inner ear, bilateral: Secondary | ICD-10-CM | POA: Diagnosis not present

## 2022-02-18 DIAGNOSIS — S61224A Laceration with foreign body of right ring finger without damage to nail, initial encounter: Secondary | ICD-10-CM | POA: Diagnosis not present

## 2022-03-05 DIAGNOSIS — Z1231 Encounter for screening mammogram for malignant neoplasm of breast: Secondary | ICD-10-CM | POA: Diagnosis not present

## 2022-03-05 DIAGNOSIS — Z01419 Encounter for gynecological examination (general) (routine) without abnormal findings: Secondary | ICD-10-CM | POA: Diagnosis not present

## 2022-03-23 DIAGNOSIS — E039 Hypothyroidism, unspecified: Secondary | ICD-10-CM | POA: Diagnosis not present

## 2022-03-23 DIAGNOSIS — E063 Autoimmune thyroiditis: Secondary | ICD-10-CM | POA: Diagnosis not present

## 2022-03-23 DIAGNOSIS — R5383 Other fatigue: Secondary | ICD-10-CM | POA: Diagnosis not present

## 2022-03-23 DIAGNOSIS — R799 Abnormal finding of blood chemistry, unspecified: Secondary | ICD-10-CM | POA: Diagnosis not present

## 2022-03-23 DIAGNOSIS — E559 Vitamin D deficiency, unspecified: Secondary | ICD-10-CM | POA: Diagnosis not present

## 2022-03-23 DIAGNOSIS — R947 Abnormal results of other endocrine function studies: Secondary | ICD-10-CM | POA: Diagnosis not present

## 2022-03-23 DIAGNOSIS — E785 Hyperlipidemia, unspecified: Secondary | ICD-10-CM | POA: Diagnosis not present

## 2022-03-23 DIAGNOSIS — R7301 Impaired fasting glucose: Secondary | ICD-10-CM | POA: Diagnosis not present

## 2022-03-23 DIAGNOSIS — R946 Abnormal results of thyroid function studies: Secondary | ICD-10-CM | POA: Diagnosis not present

## 2022-03-23 DIAGNOSIS — E782 Mixed hyperlipidemia: Secondary | ICD-10-CM | POA: Diagnosis not present

## 2022-03-23 DIAGNOSIS — E7841 Elevated Lipoprotein(a): Secondary | ICD-10-CM | POA: Diagnosis not present

## 2022-03-23 DIAGNOSIS — D6489 Other specified anemias: Secondary | ICD-10-CM | POA: Diagnosis not present

## 2022-05-07 DIAGNOSIS — H6123 Impacted cerumen, bilateral: Secondary | ICD-10-CM | POA: Diagnosis not present

## 2022-07-04 DIAGNOSIS — Z23 Encounter for immunization: Secondary | ICD-10-CM | POA: Diagnosis not present

## 2022-08-22 DIAGNOSIS — Z23 Encounter for immunization: Secondary | ICD-10-CM | POA: Diagnosis not present

## 2022-09-05 DIAGNOSIS — R051 Acute cough: Secondary | ICD-10-CM | POA: Diagnosis not present

## 2022-09-05 DIAGNOSIS — J189 Pneumonia, unspecified organism: Secondary | ICD-10-CM | POA: Diagnosis not present

## 2022-09-11 DIAGNOSIS — H5213 Myopia, bilateral: Secondary | ICD-10-CM | POA: Diagnosis not present

## 2022-09-11 DIAGNOSIS — H2513 Age-related nuclear cataract, bilateral: Secondary | ICD-10-CM | POA: Diagnosis not present

## 2022-09-11 DIAGNOSIS — H353131 Nonexudative age-related macular degeneration, bilateral, early dry stage: Secondary | ICD-10-CM | POA: Diagnosis not present

## 2022-10-25 DIAGNOSIS — H903 Sensorineural hearing loss, bilateral: Secondary | ICD-10-CM | POA: Diagnosis not present

## 2022-10-25 DIAGNOSIS — H838X3 Other specified diseases of inner ear, bilateral: Secondary | ICD-10-CM | POA: Diagnosis not present

## 2022-10-25 DIAGNOSIS — H6123 Impacted cerumen, bilateral: Secondary | ICD-10-CM | POA: Diagnosis not present

## 2022-11-21 DIAGNOSIS — E039 Hypothyroidism, unspecified: Secondary | ICD-10-CM | POA: Diagnosis not present

## 2022-11-21 DIAGNOSIS — H2513 Age-related nuclear cataract, bilateral: Secondary | ICD-10-CM | POA: Diagnosis not present

## 2022-11-21 DIAGNOSIS — E785 Hyperlipidemia, unspecified: Secondary | ICD-10-CM | POA: Diagnosis not present

## 2022-11-21 DIAGNOSIS — Z86010 Personal history of colonic polyps: Secondary | ICD-10-CM | POA: Diagnosis not present

## 2022-12-23 DIAGNOSIS — R051 Acute cough: Secondary | ICD-10-CM | POA: Diagnosis not present

## 2022-12-23 DIAGNOSIS — Z03818 Encounter for observation for suspected exposure to other biological agents ruled out: Secondary | ICD-10-CM | POA: Diagnosis not present

## 2022-12-23 DIAGNOSIS — J029 Acute pharyngitis, unspecified: Secondary | ICD-10-CM | POA: Diagnosis not present

## 2022-12-23 DIAGNOSIS — R5383 Other fatigue: Secondary | ICD-10-CM | POA: Diagnosis not present

## 2022-12-23 DIAGNOSIS — B349 Viral infection, unspecified: Secondary | ICD-10-CM | POA: Diagnosis not present

## 2022-12-31 DIAGNOSIS — J209 Acute bronchitis, unspecified: Secondary | ICD-10-CM | POA: Diagnosis not present

## 2023-01-24 ENCOUNTER — Encounter: Payer: Self-pay | Admitting: Gastroenterology

## 2023-01-31 ENCOUNTER — Encounter: Payer: Self-pay | Admitting: Gastroenterology

## 2023-02-12 DIAGNOSIS — H6123 Impacted cerumen, bilateral: Secondary | ICD-10-CM | POA: Diagnosis not present

## 2023-03-05 DIAGNOSIS — R7301 Impaired fasting glucose: Secondary | ICD-10-CM | POA: Diagnosis not present

## 2023-03-05 DIAGNOSIS — D6489 Other specified anemias: Secondary | ICD-10-CM | POA: Diagnosis not present

## 2023-03-05 DIAGNOSIS — E063 Autoimmune thyroiditis: Secondary | ICD-10-CM | POA: Diagnosis not present

## 2023-03-05 DIAGNOSIS — Z79890 Hormone replacement therapy (postmenopausal): Secondary | ICD-10-CM | POA: Diagnosis not present

## 2023-03-05 DIAGNOSIS — R946 Abnormal results of thyroid function studies: Secondary | ICD-10-CM | POA: Diagnosis not present

## 2023-03-05 DIAGNOSIS — E7841 Elevated Lipoprotein(a): Secondary | ICD-10-CM | POA: Diagnosis not present

## 2023-03-05 DIAGNOSIS — R5383 Other fatigue: Secondary | ICD-10-CM | POA: Diagnosis not present

## 2023-03-05 DIAGNOSIS — E782 Mixed hyperlipidemia: Secondary | ICD-10-CM | POA: Diagnosis not present

## 2023-03-05 DIAGNOSIS — E559 Vitamin D deficiency, unspecified: Secondary | ICD-10-CM | POA: Diagnosis not present

## 2023-03-05 DIAGNOSIS — R947 Abnormal results of other endocrine function studies: Secondary | ICD-10-CM | POA: Diagnosis not present

## 2023-03-05 DIAGNOSIS — R799 Abnormal finding of blood chemistry, unspecified: Secondary | ICD-10-CM | POA: Diagnosis not present

## 2023-03-05 DIAGNOSIS — E039 Hypothyroidism, unspecified: Secondary | ICD-10-CM | POA: Diagnosis not present

## 2023-03-21 DIAGNOSIS — M25519 Pain in unspecified shoulder: Secondary | ICD-10-CM | POA: Diagnosis not present

## 2023-03-21 DIAGNOSIS — M25579 Pain in unspecified ankle and joints of unspecified foot: Secondary | ICD-10-CM | POA: Diagnosis not present

## 2023-03-26 DIAGNOSIS — M25519 Pain in unspecified shoulder: Secondary | ICD-10-CM | POA: Diagnosis not present

## 2023-03-26 DIAGNOSIS — M25579 Pain in unspecified ankle and joints of unspecified foot: Secondary | ICD-10-CM | POA: Diagnosis not present

## 2023-04-04 DIAGNOSIS — M25519 Pain in unspecified shoulder: Secondary | ICD-10-CM | POA: Diagnosis not present

## 2023-04-04 DIAGNOSIS — M25579 Pain in unspecified ankle and joints of unspecified foot: Secondary | ICD-10-CM | POA: Diagnosis not present

## 2023-04-11 DIAGNOSIS — M25519 Pain in unspecified shoulder: Secondary | ICD-10-CM | POA: Diagnosis not present

## 2023-04-11 DIAGNOSIS — M25579 Pain in unspecified ankle and joints of unspecified foot: Secondary | ICD-10-CM | POA: Diagnosis not present

## 2023-04-25 DIAGNOSIS — M25519 Pain in unspecified shoulder: Secondary | ICD-10-CM | POA: Diagnosis not present

## 2023-04-25 DIAGNOSIS — M25579 Pain in unspecified ankle and joints of unspecified foot: Secondary | ICD-10-CM | POA: Diagnosis not present

## 2023-04-27 DIAGNOSIS — M25519 Pain in unspecified shoulder: Secondary | ICD-10-CM | POA: Diagnosis not present

## 2023-04-27 DIAGNOSIS — M25579 Pain in unspecified ankle and joints of unspecified foot: Secondary | ICD-10-CM | POA: Diagnosis not present

## 2023-05-01 DIAGNOSIS — M25519 Pain in unspecified shoulder: Secondary | ICD-10-CM | POA: Diagnosis not present

## 2023-05-01 DIAGNOSIS — M25579 Pain in unspecified ankle and joints of unspecified foot: Secondary | ICD-10-CM | POA: Diagnosis not present

## 2023-05-08 DIAGNOSIS — M25579 Pain in unspecified ankle and joints of unspecified foot: Secondary | ICD-10-CM | POA: Diagnosis not present

## 2023-05-08 DIAGNOSIS — M25519 Pain in unspecified shoulder: Secondary | ICD-10-CM | POA: Diagnosis not present

## 2023-05-15 DIAGNOSIS — M25519 Pain in unspecified shoulder: Secondary | ICD-10-CM | POA: Diagnosis not present

## 2023-05-15 DIAGNOSIS — M25579 Pain in unspecified ankle and joints of unspecified foot: Secondary | ICD-10-CM | POA: Diagnosis not present

## 2023-05-22 DIAGNOSIS — M25519 Pain in unspecified shoulder: Secondary | ICD-10-CM | POA: Diagnosis not present

## 2023-05-22 DIAGNOSIS — M25579 Pain in unspecified ankle and joints of unspecified foot: Secondary | ICD-10-CM | POA: Diagnosis not present

## 2023-05-24 DIAGNOSIS — Z0000 Encounter for general adult medical examination without abnormal findings: Secondary | ICD-10-CM | POA: Diagnosis not present

## 2023-05-24 DIAGNOSIS — Z1331 Encounter for screening for depression: Secondary | ICD-10-CM | POA: Diagnosis not present

## 2023-05-24 DIAGNOSIS — E039 Hypothyroidism, unspecified: Secondary | ICD-10-CM | POA: Diagnosis not present

## 2023-05-24 DIAGNOSIS — E785 Hyperlipidemia, unspecified: Secondary | ICD-10-CM | POA: Diagnosis not present

## 2023-05-29 DIAGNOSIS — M25579 Pain in unspecified ankle and joints of unspecified foot: Secondary | ICD-10-CM | POA: Diagnosis not present

## 2023-05-29 DIAGNOSIS — M25519 Pain in unspecified shoulder: Secondary | ICD-10-CM | POA: Diagnosis not present

## 2023-05-30 DIAGNOSIS — E785 Hyperlipidemia, unspecified: Secondary | ICD-10-CM | POA: Diagnosis not present

## 2023-05-30 DIAGNOSIS — R635 Abnormal weight gain: Secondary | ICD-10-CM | POA: Diagnosis not present

## 2023-05-30 DIAGNOSIS — R946 Abnormal results of thyroid function studies: Secondary | ICD-10-CM | POA: Diagnosis not present

## 2023-05-30 DIAGNOSIS — E039 Hypothyroidism, unspecified: Secondary | ICD-10-CM | POA: Diagnosis not present

## 2023-06-05 DIAGNOSIS — M25519 Pain in unspecified shoulder: Secondary | ICD-10-CM | POA: Diagnosis not present

## 2023-06-05 DIAGNOSIS — M25579 Pain in unspecified ankle and joints of unspecified foot: Secondary | ICD-10-CM | POA: Diagnosis not present

## 2023-06-12 ENCOUNTER — Encounter: Payer: Self-pay | Admitting: Gastroenterology

## 2023-06-12 ENCOUNTER — Ambulatory Visit (INDEPENDENT_AMBULATORY_CARE_PROVIDER_SITE_OTHER): Payer: Medicare Other | Admitting: Gastroenterology

## 2023-06-12 VITALS — BP 114/62 | HR 68 | Ht 63.0 in | Wt 154.4 lb

## 2023-06-12 DIAGNOSIS — Z860101 Personal history of adenomatous and serrated colon polyps: Secondary | ICD-10-CM

## 2023-06-12 DIAGNOSIS — Z9189 Other specified personal risk factors, not elsewhere classified: Secondary | ICD-10-CM

## 2023-06-12 MED ORDER — NA SULFATE-K SULFATE-MG SULF 17.5-3.13-1.6 GM/177ML PO SOLN: 1.0000 | Freq: Once | ORAL | 0 refills | Status: AC

## 2023-06-12 NOTE — Progress Notes (Signed)
Assessment    Personal history of sessile serrated colon polyps on last colonoscopy and adenomatous colon polyps on prior colonoscopy GERD, controlled History of difficult intubation   Recommendations   Schedule colonoscopy at hospital. The risks (including bleeding, perforation, infection, missed lesions, medication reactions and possible hospitalization or surgery if complications occur), benefits, and alternatives to colonoscopy with possible biopsy and possible polypectomy were discussed with the patient and they consent to proceed.   Continue antireflux measures   HPI   Chief complaint: Personal history of colon polyps  Patient profile:  April [name] is a 72 y.o. female referred by Ferdinand Cava, MD for history of sessile serrated and adenomatous colon polyps.  Most recent colonoscopy as below was performed 5 years ago showed 2 small sessile serrated polyps.  Prior colonoscopy in 2013 showed 1 small adenomatous polyp.  Prior colonoscopies performed at [facility] long hospital due to history of a difficult intubation.  Reflux symptoms are well-controlled on diet and a digestive enzyme supplement.  Denies weight loss, abdominal pain, constipation, diarrhea, change in stool caliber, melena, hematochezia, nausea, vomiting, dysphagia, chest pain.   Previous Labs / Imaging:    Latest Ref Rng & Units 11/12/2013   12:17 PM 11/02/2011    5:09 AM 11/01/2011    3:20 AM  CBC  WBC 4.6 - 10.2 K/uL 11.2  8.8  11.0   Hemoglobin 12.2 - 16.2 g/dL 16.1  09.6  04.5   Hematocrit 37.7 - 47.9 % 42.3  36.5  33.7   Platelets 150 - 400 K/uL  189  210     No results found for: "LIPASE"    Latest Ref Rng & Units 11/02/2011    5:09 AM 11/01/2011    3:20 AM 10/31/2011    1:44 PM  CMP  Glucose 70 - 99 mg/dL 409  811  914   BUN 6 - 23 mg/dL 10  9    Creatinine 7.82 - 1.10 mg/dL 9.56  2.13    Sodium 086 - 145 mEq/L 136  138  140   Potassium 3.5 - 5.1 mEq/L 3.8  4.0  3.7   Chloride 96 - 112 mEq/L 102   106    CO2 19 - 32 mEq/L 27  26    Calcium 8.4 - 10.5 mg/dL 9.2  9.1       Previous GI evaluation    Endoscopies:  Colonoscopy June 2019 - Two 6 mm polyps in the sigmoid colon and at the appendiceal orifice, removed with a cold snare. Resected and retrieved.  (SSPs) - Moderate diverticulosis in the left colon. There was no evidence of diverticular bleeding.  - The examination was otherwise normal on direct and retroflexion views.  Imaging:     Past Medical History:  Diagnosis Date   Anemia    iron   Bronchitis    Cancer (HCC)    thymoma    Candidiasis of intestine    Cataract    beginning stages    Complication of anesthesia    pt states they said it took 3 attempts to get tube down-small esophagus   Fatigue    GERD (gastroesophageal reflux disease)    Hypercholesteremia    Hypochlorhydria    Hypothyroidism    Insomnia    Mineral deficiency    Obesity    Osteopenia    Pneumonia    Radiculopathy, lumbar region    Recurrent upper respiratory infection (URI)    COLD IN JAN....STILL HAS OCCAS. COUGHING  Sleep apnea    pt verbalize she no longer has sleep apnea and doesn't use cpap   Vitamin D deficiency    Vitamin D deficiency    Past Surgical History:  Procedure Laterality Date   COLONOSCOPY  08/25/2012   Procedure: COLONOSCOPY;  Surgeon: Meryl Dare, MD,FACG;  Location: [facility];  Service: Endoscopy;  Laterality: N/A;   COLONOSCOPY     COLONOSCOPY WITH PROPOFOL N/A 02/10/2018   Procedure: COLONOSCOPY WITH PROPOFOL;  Surgeon: Meryl Dare, MD;  Location: [facility];  Service: Endoscopy;  Laterality: N/A;   GALLBLADDER SURGERY     LAPAROSCOPIC CHOLECYSTECTOMY     LUNG BIOPSY     MEDIASTERNOTOMY  10/31/2011   Procedure: MEDIASTERNOTOMY;  Surgeon: Delight Ovens, MD;  Location: [facility] OR;  Service: Thoracic;  Laterality: N/A;  RESECTION OF THYMOMA, FULL STERNOTOMY   POLYPECTOMY     POLYPECTOMY  02/10/2018   Procedure: POLYPECTOMY;  Surgeon: Meryl Dare, MD;  Location: [facility];  Service: Endoscopy;;   stenotomy      STENOTOMY/THYMECTOMY   TONSILLECTOMY     Family History  Problem Relation Age of Onset   Colon cancer Neg Hx    Colon polyps Neg Hx    Esophageal cancer Neg Hx    Rectal cancer Neg Hx    Stomach cancer Neg Hx    Social History   Tobacco Use   Smoking status: Former    Current packs/day: 0.00    Average packs/day: 1 pack/day for 17.0 years (17.0 ttl pk-yrs)    Types: Cigarettes    Start date: 09/25/1966    Quit date: 09/26/1983    Years since quitting: 39.7   Smokeless tobacco: Never  Vaping Use   Vaping status: Never Used  Substance Use Topics   Alcohol use: Yes    Comment: occasional   Drug use: No   Current Outpatient Medications  Medication Sig Dispense Refill   BETAINE PO Take 5 tablets by mouth 3 (three) times daily with meals.      Digestive Enzymes (DIGESTIVE ENZYME PO) Take 1 tablet by mouth 3 (three) times daily with meals.     liothyronine (CYTOMEL) 25 MCG tablet Take 12.5 mcg by mouth daily with lunch.     Magnesium 250 MG TABS Take 500 mg by mouth at bedtime.     Menaquinone-7 (VITAMIN K2 PO) Take 15,000 mcg by mouth daily.     Methylcobalamin 5000 MCG SUBL Place 5,000 mcg under the tongue daily.     Multiple Vitamins-Minerals (MULTIVITAMIN ADULT PO) Take 3 tablets by mouth daily.      NON FORMULARY Take 125 mg by mouth daily. Annatto tocotrienols  125 mg otc supplement     NON FORMULARY Take 1 Dose by mouth at bedtime. serrapeptase   250000 SPU     NON FORMULARY Take 1 tablet by mouth daily. Glycoberine-MX  otc supplement     Omega-3 Fatty Acids (FISH OIL PO) Take 2,150 mg by mouth 2 (two) times daily.     OVER THE COUNTER MEDICATION Hemagenics  1 tablet po daily with food     OVER THE COUNTER MEDICATION Take 12.5 mg by mouth daily. Iodine otc supplement     thyroid (ARMOUR) 120 MG tablet Take 60-120 mg by mouth See admin instructions. Take 120 mg on day 1 then 60 mg on day 2,  alternating 120 and 60 mg every other day     Vitamin D-Vitamin K (D3 + K2 DOTS PO)  Take 1 tablet by mouth daily.     No current facility-administered medications for this visit.   No Known Allergies  Review of Systems: All other systems reviewed and negative except where noted in HPI.    Physical Exam    Wt Readings from Last 3 Encounters:  02/02/20 161 lb (73 kg)  10/27/19 161 lb (73 kg)  02/14/18 161 lb (73 kg)    There were no vitals taken for this visit. Constitutional:  Generally well appearing female in no acute distress. HEENT: Pupils normal.  Conjunctivae are normal. No scleral icterus. No oral lesions or deformities noted.  Neck: Supple.  Cardiac: Normal rate, regular rhythm without murmurs. Pulmonary/chest: Effort normal and breath sounds normal. No wheezing, rales or rhonchi. Abdominal: Soft, nondistended, nontender. Active bowel sounds. No palpable HSM, masses or hernias. Rectal: Deferred to colonoscopy Extremities: No edema or deformities noted Neurological: Alert and oriented to person, place and time. Psychiatric: Pleasant. Normal mood and affect. Behavior is normal. Skin: Skin is warm and dry. No rashes noted.  Claudette Head, MD   cc:  Referring Provider Ferdinand Cava, MD

## 2023-06-12 NOTE — H&P (View-Only) (Signed)
Assessment    Personal history of sessile serrated colon polyps on last colonoscopy and adenomatous colon polyps on prior colonoscopy GERD, controlled History of difficult intubation   Recommendations   Schedule colonoscopy at hospital. The risks (including bleeding, perforation, infection, missed lesions, medication reactions and possible hospitalization or surgery if complications occur), benefits, and alternatives to colonoscopy with possible biopsy and possible polypectomy were discussed with the patient and they consent to proceed.   Continue antireflux measures   HPI   Chief complaint: Personal history of colon polyps  Patient profile:  April [name] is a 72 y.o. female referred by Ferdinand Cava, MD for history of sessile serrated and adenomatous colon polyps.  Most recent colonoscopy as below was performed 5 years ago showed 2 small sessile serrated polyps.  Prior colonoscopy in 2013 showed 1 small adenomatous polyp.  Prior colonoscopies performed at [facility] due to history of a difficult intubation.  Reflux symptoms are well-controlled on diet and a digestive enzyme supplement.  Denies weight loss, abdominal pain, constipation, diarrhea, change in stool caliber, melena, hematochezia, nausea, vomiting, dysphagia, chest pain.   Previous Labs / Imaging:    Latest Ref Rng & Units 11/12/2013   12:17 PM 11/02/2011    5:09 AM 11/01/2011    3:20 AM  CBC  WBC 4.6 - 10.2 K/uL 11.2  8.8  11.0   Hemoglobin 12.2 - 16.2 g/dL 23.5  57.3  22.0   Hematocrit 37.7 - 47.9 % 42.3  36.5  33.7   Platelets 150 - 400 K/uL  189  210     No results found for: "LIPASE"    Latest Ref Rng & Units 11/02/2011    5:09 AM 11/01/2011    3:20 AM 10/31/2011    1:44 PM  CMP  Glucose 70 - 99 mg/dL 254  270  623   BUN 6 - 23 mg/dL 10  9    Creatinine 7.62 - 1.10 mg/dL 8.31  5.17    Sodium 616 - 145 mEq/L 136  138  140   Potassium 3.5 - 5.1 mEq/L 3.8  4.0  3.7   Chloride 96 - 112 mEq/L 102   106    CO2 19 - 32 mEq/L 27  26    Calcium 8.4 - 10.5 mg/dL 9.2  9.1       Previous GI evaluation    Endoscopies:  Colonoscopy June 2019 - Two 6 mm polyps in the sigmoid colon and at the appendiceal orifice, removed with a cold snare. Resected and retrieved.  (SSPs) - Moderate diverticulosis in the left colon. There was no evidence of diverticular bleeding.  - The examination was otherwise normal on direct and retroflexion views.  Imaging:     Past Medical History:  Diagnosis Date   Anemia    iron   Bronchitis    Cancer (HCC)    thymoma    Candidiasis of intestine    Cataract    beginning stages    Complication of anesthesia    pt states they said it took 3 attempts to get tube down-small esophagus   Fatigue    GERD (gastroesophageal reflux disease)    Hypercholesteremia    Hypochlorhydria    Hypothyroidism    Insomnia    Mineral deficiency    Obesity    Osteopenia    Pneumonia    Radiculopathy, lumbar region    Recurrent upper respiratory infection (URI)    COLD IN JAN....STILL HAS OCCAS. COUGHING  Sleep apnea    pt verbalize she no longer has sleep apnea and doesn't use cpap   Vitamin D deficiency    Vitamin D deficiency    Past Surgical History:  Procedure Laterality Date   COLONOSCOPY  08/25/2012   Procedure: COLONOSCOPY;  Surgeon: Meryl Dare, MD,FACG;  Location: [facility];  Service: Endoscopy;  Laterality: N/A;   COLONOSCOPY     COLONOSCOPY WITH PROPOFOL N/A 02/10/2018   Procedure: COLONOSCOPY WITH PROPOFOL;  Surgeon: Meryl Dare, MD;  Location: [facility];  Service: Endoscopy;  Laterality: N/A;   GALLBLADDER SURGERY     LAPAROSCOPIC CHOLECYSTECTOMY     LUNG BIOPSY     MEDIASTERNOTOMY  10/31/2011   Procedure: MEDIASTERNOTOMY;  Surgeon: Delight Ovens, MD;  Location: [facility] OR;  Service: Thoracic;  Laterality: N/A;  RESECTION OF THYMOMA, FULL STERNOTOMY   POLYPECTOMY     POLYPECTOMY  02/10/2018   Procedure: POLYPECTOMY;  Surgeon: Meryl Dare, MD;  Location: [facility];  Service: Endoscopy;;   stenotomy      STENOTOMY/THYMECTOMY   TONSILLECTOMY     Family History  Problem Relation Age of Onset   Colon cancer Neg Hx    Colon polyps Neg Hx    Esophageal cancer Neg Hx    Rectal cancer Neg Hx    Stomach cancer Neg Hx    Social History   Tobacco Use   Smoking status: Former    Current packs/day: 0.00    Average packs/day: 1 pack/day for 17.0 years (17.0 ttl pk-yrs)    Types: Cigarettes    Start date: 09/25/1966    Quit date: 09/26/1983    Years since quitting: 39.7   Smokeless tobacco: Never  Vaping Use   Vaping status: Never Used  Substance Use Topics   Alcohol use: Yes    Comment: occasional   Drug use: No   Current Outpatient Medications  Medication Sig Dispense Refill   BETAINE PO Take 5 tablets by mouth 3 (three) times daily with meals.      Digestive Enzymes (DIGESTIVE ENZYME PO) Take 1 tablet by mouth 3 (three) times daily with meals.     liothyronine (CYTOMEL) 25 MCG tablet Take 12.5 mcg by mouth daily with lunch.     Magnesium 250 MG TABS Take 500 mg by mouth at bedtime.     Menaquinone-7 (VITAMIN K2 PO) Take 15,000 mcg by mouth daily.     Methylcobalamin 5000 MCG SUBL Place 5,000 mcg under the tongue daily.     Multiple Vitamins-Minerals (MULTIVITAMIN ADULT PO) Take 3 tablets by mouth daily.      NON FORMULARY Take 125 mg by mouth daily. Annatto tocotrienols  125 mg otc supplement     NON FORMULARY Take 1 Dose by mouth at bedtime. serrapeptase   250000 SPU     NON FORMULARY Take 1 tablet by mouth daily. Glycoberine-MX  otc supplement     Omega-3 Fatty Acids (FISH OIL PO) Take 2,150 mg by mouth 2 (two) times daily.     OVER THE COUNTER MEDICATION Hemagenics  1 tablet po daily with food     OVER THE COUNTER MEDICATION Take 12.5 mg by mouth daily. Iodine otc supplement     thyroid (ARMOUR) 120 MG tablet Take 60-120 mg by mouth See admin instructions. Take 120 mg on day 1 then 60 mg on day 2,  alternating 120 and 60 mg every other day     Vitamin D-Vitamin K (D3 + K2 DOTS PO)  Take 1 tablet by mouth daily.     No current facility-administered medications for this visit.   No Known Allergies  Review of Systems: All other systems reviewed and negative except where noted in HPI.    Physical Exam    Wt Readings from Last 3 Encounters:  02/02/20 161 lb (73 kg)  10/27/19 161 lb (73 kg)  02/14/18 161 lb (73 kg)    There were no vitals taken for this visit. Constitutional:  Generally well appearing female in no acute distress. HEENT: Pupils normal.  Conjunctivae are normal. No scleral icterus. No oral lesions or deformities noted.  Neck: Supple.  Cardiac: Normal rate, regular rhythm without murmurs. Pulmonary/chest: Effort normal and breath sounds normal. No wheezing, rales or rhonchi. Abdominal: Soft, nondistended, nontender. Active bowel sounds. No palpable HSM, masses or hernias. Rectal: Deferred to colonoscopy Extremities: No edema or deformities noted Neurological: Alert and oriented to person, place and time. Psychiatric: Pleasant. Normal mood and affect. Behavior is normal. Skin: Skin is warm and dry. No rashes noted.  Claudette Head, MD   cc:  Referring Provider Ferdinand Cava, MD

## 2023-06-12 NOTE — Patient Instructions (Signed)
You have been scheduled for a colonoscopy. Please follow written instructions given to you at your visit today.   Please pick up your prep supplies at the pharmacy within the next 1-3 days.  If you use inhalers (even only as needed), please bring them with you on the day of your procedure.  DO NOT TAKE 7 DAYS PRIOR TO TEST- Trulicity (dulaglutide) Ozempic, Wegovy (semaglutide) Mounjaro (tirzepatide) Bydureon Bcise (exanatide extended release)  DO NOT TAKE 1 DAY PRIOR TO YOUR TEST Rybelsus (semaglutide) Adlyxin (lixisenatide) Victoza (liraglutide) Byetta (exanatide) ___________________________________________________________________________  The [facility] providers would like to encourage you to use [facility] to communicate with providers for non-urgent requests or questions.  Due to long hold times on the telephone, sending your provider a message by [facility] may be a faster and more efficient way to get a response.  Please allow 48 business hours for a response.  Please remember that this is for non-urgent requests.   Due to recent changes in healthcare laws, you may see the results of your imaging and laboratory studies on MyChart before your provider has had a chance to review them.  We understand that in some cases there may be results that are confusing or concerning to you. Not all laboratory results come back in the same time frame and the provider may be waiting for multiple results in order to interpret others.  Please give [place] 48 hours in order for your provider to thoroughly review all the results before contacting the office for clarification of your results.   Thank you for choosing me and [facility].  Venita Lick. Pleas Koch., MD., Clementeen Graham

## 2023-06-14 ENCOUNTER — Encounter (HOSPITAL_COMMUNITY): Payer: Self-pay | Admitting: Gastroenterology

## 2023-06-20 ENCOUNTER — Ambulatory Visit (HOSPITAL_COMMUNITY): Payer: Medicare Other | Admitting: Anesthesiology

## 2023-06-20 ENCOUNTER — Ambulatory Visit (HOSPITAL_COMMUNITY)
Admission: RE | Admit: 2023-06-20 | Discharge: 2023-06-20 | Disposition: A | Discharge status: Home / Self Care | Payer: Medicare Other | Source: Ambulatory Visit | Attending: Gastroenterology | Admitting: Gastroenterology

## 2023-06-20 ENCOUNTER — Ambulatory Visit (HOSPITAL_BASED_OUTPATIENT_CLINIC_OR_DEPARTMENT_OTHER): Payer: Medicare Other | Admitting: Anesthesiology

## 2023-06-20 ENCOUNTER — Encounter (HOSPITAL_COMMUNITY)
Admission: RE | Disposition: A | Discharge status: Home / Self Care | Payer: Self-pay | Source: Ambulatory Visit | Attending: Gastroenterology

## 2023-06-20 ENCOUNTER — Other Ambulatory Visit: Payer: Self-pay

## 2023-06-20 ENCOUNTER — Encounter (HOSPITAL_COMMUNITY): Payer: Self-pay | Admitting: Gastroenterology

## 2023-06-20 DIAGNOSIS — Z1211 Encounter for screening for malignant neoplasm of colon: Secondary | ICD-10-CM | POA: Diagnosis not present

## 2023-06-20 DIAGNOSIS — Z87891 Personal history of nicotine dependence: Secondary | ICD-10-CM | POA: Diagnosis not present

## 2023-06-20 DIAGNOSIS — D649 Anemia, unspecified: Secondary | ICD-10-CM | POA: Diagnosis not present

## 2023-06-20 DIAGNOSIS — D123 Benign neoplasm of transverse colon: Secondary | ICD-10-CM | POA: Diagnosis not present

## 2023-06-20 DIAGNOSIS — K5730 Diverticulosis of large intestine without perforation or abscess without bleeding: Secondary | ICD-10-CM | POA: Insufficient documentation

## 2023-06-20 DIAGNOSIS — G4730 Sleep apnea, unspecified: Secondary | ICD-10-CM | POA: Diagnosis not present

## 2023-06-20 DIAGNOSIS — E039 Hypothyroidism, unspecified: Secondary | ICD-10-CM

## 2023-06-20 DIAGNOSIS — Z860100 Personal history of colon polyps, unspecified: Secondary | ICD-10-CM | POA: Diagnosis not present

## 2023-06-20 DIAGNOSIS — Z9189 Other specified personal risk factors, not elsewhere classified: Secondary | ICD-10-CM

## 2023-06-20 DIAGNOSIS — K640 First degree hemorrhoids: Secondary | ICD-10-CM | POA: Insufficient documentation

## 2023-06-20 DIAGNOSIS — Z860101 Personal history of adenomatous and serrated colon polyps: Secondary | ICD-10-CM

## 2023-06-20 DIAGNOSIS — K635 Polyp of colon: Secondary | ICD-10-CM

## 2023-06-20 DIAGNOSIS — Z09 Encounter for follow-up examination after completed treatment for conditions other than malignant neoplasm: Secondary | ICD-10-CM | POA: Diagnosis not present

## 2023-06-20 DIAGNOSIS — K219 Gastro-esophageal reflux disease without esophagitis: Secondary | ICD-10-CM | POA: Diagnosis not present

## 2023-06-20 HISTORY — PX: POLYPECTOMY: SHX5525

## 2023-06-20 HISTORY — PX: COLONOSCOPY WITH PROPOFOL: SHX5780

## 2023-06-20 SURGERY — COLONOSCOPY WITH PROPOFOL
Anesthesia: Monitor Anesthesia Care

## 2023-06-20 MED ORDER — PROPOFOL 500 MG/50ML IV EMUL
INTRAVENOUS | Status: AC
  Filled 2023-06-20: qty 50

## 2023-06-20 MED ORDER — PROPOFOL 500 MG/50ML IV EMUL
INTRAVENOUS | Status: DC | PRN
  Administered 2023-06-20: 100 ug/kg/min via INTRAVENOUS

## 2023-06-20 MED ORDER — LIDOCAINE 2% (20 MG/ML) 5 ML SYRINGE
INTRAMUSCULAR | Status: DC | PRN
  Administered 2023-06-20: 60 mg via INTRAVENOUS

## 2023-06-20 MED ORDER — PROPOFOL 10 MG/ML IV BOLUS
INTRAVENOUS | Status: DC | PRN
  Administered 2023-06-20 (×3): 20 mg via INTRAVENOUS

## 2023-06-20 MED ORDER — SODIUM CHLORIDE 0.9 % IV SOLN: INTRAVENOUS | Status: DC

## 2023-06-20 SURGICAL SUPPLY — 22 items

## 2023-06-20 NOTE — Anesthesia Postprocedure Evaluation (Signed)
Anesthesia Post Note  Patient: Jenna Luo  Procedure(s) Performed: COLONOSCOPY WITH PROPOFOL POLYPECTOMY     Patient location during evaluation: PACU Anesthesia Type: MAC Level of consciousness: awake and alert Pain management: pain level controlled Vital Signs Assessment: post-procedure vital signs reviewed and stable Respiratory status: spontaneous breathing, nonlabored ventilation, respiratory function stable and patient connected to nasal cannula oxygen Cardiovascular status: stable and blood pressure returned to baseline Postop Assessment: no apparent nausea or vomiting Anesthetic complications: no  No notable events documented.  Last Vitals:  Vitals:   06/20/23 1200 06/20/23 1210  BP: (!) 105/56 (!) 104/54  Pulse: (!) 56 60  Resp: 13 14  Temp:    SpO2: 100% 100%    Last Pain:  Vitals:   06/20/23 1210  TempSrc:   PainSc: 0-No pain                 Shelton Silvas

## 2023-06-20 NOTE — Transfer of Care (Signed)
Immediate Anesthesia Transfer of Care Note  Patient: April [name]  Procedure(s) Performed: COLONOSCOPY WITH PROPOFOL POLYPECTOMY  Patient Location: PACU and Endoscopy Unit  Anesthesia Type:MAC  Level of Consciousness: awake, alert , oriented, and patient cooperative  Airway & Oxygen Therapy: Patient Spontanous Breathing and Patient connected to face mask oxygen  Post-op Assessment: Report given to RN and Post -op Vital signs reviewed and stable  Post vital signs: Reviewed and stable  Last Vitals:  Vitals Value Taken Time  BP 97/46 06/20/23 1147  Temp 36.4 C 06/20/23 1146  Pulse 68 06/20/23 1148  Resp 12 06/20/23 1148  SpO2 97 % 06/20/23 1148  Vitals shown include unfiled device data.  Last Pain:  Vitals:   06/20/23 1146  TempSrc: Temporal  PainSc: 0-No pain         Complications: No notable events documented.

## 2023-06-20 NOTE — Anesthesia Preprocedure Evaluation (Addendum)
Anesthesia Evaluation  Patient identified by MRN, date of birth, ID band Patient awake    Reviewed: Allergy & Precautions, NPO status , Patient's Chart, lab work & pertinent test results  Airway Mallampati: II  TM Distance: >3 FB Neck ROM: Full    Dental  (+) Teeth Intact, Dental Advisory Given   Pulmonary sleep apnea , Recent URI , former smoker   breath sounds clear to auscultation       Cardiovascular negative cardio ROS  Rhythm:Regular Rate:Normal     Neuro/Psych  Neuromuscular disease  negative psych ROS   GI/Hepatic Neg liver ROS,GERD  ,,  Endo/Other  Hypothyroidism    Renal/GU negative Renal ROS     Musculoskeletal negative musculoskeletal ROS (+)    Abdominal   Peds  Hematology  (+) Blood dyscrasia, anemia   Anesthesia Other Findings Braces  Reproductive/Obstetrics                             Anesthesia Physical Anesthesia Plan  ASA: 3  Anesthesia Plan: MAC   Post-op Pain Management:    Induction: Intravenous  PONV Risk Score and Plan: 0 and Propofol infusion  Airway Management Planned: Natural Airway  Additional Equipment: None  Intra-op Plan:   Post-operative Plan:   Informed Consent:   Plan Discussed with: CRNA  Anesthesia Plan Comments:        Anesthesia Quick Evaluation

## 2023-06-20 NOTE — Op Note (Signed)
[facility] Patient Name: April [name] Procedure Date: 06/20/2023 MRN: [id number] Attending MD: Meryl Dare , MD, [phone] Date of Birth: 1950-12-13 CSN: [id number] Age: 72 Admit Type: Outpatient Procedure:                Colonoscopy Indications:              High risk colon cancer surveillance: Personal                            history of sessile serrated colon polyp (less than                            10 mm in size) with no dysplasia Providers:                Venita Lick. Russella Dar, MD, Marge Duncans, RN, Beryle Beams, Technician, Geoffery Lyons, Technician Referring MD:             Dorris Singh. Margaretann Loveless, MD Medicines:                Monitored Anesthesia Care Complications:            No immediate complications. Estimated blood loss:                            None. Estimated Blood Loss:     Estimated blood loss: none. Procedure:                Pre-Anesthesia Assessment:                           - Prior to the procedure, a History and Physical                            was performed, and patient medications and                            allergies were reviewed. The patient's tolerance of                            previous anesthesia was also reviewed. The risks                            and benefits of the procedure and the sedation                            options and risks were discussed with the patient.                            All questions were answered, and informed consent                            was obtained. Prior Anticoagulants: The patient has  taken no anticoagulant or antiplatelet agents. ASA                            Grade Assessment: II - A patient with mild systemic                            disease. After reviewing the risks and benefits,                            the patient was deemed in satisfactory condition to                            undergo the procedure.                            After obtaining informed consent, the colonoscope                            was passed under direct vision. Throughout the                            procedure, the patient's blood pressure, pulse, and                            oxygen saturations were monitored continuously. The                            [id number]-HQ190L ([id number]) Olympus colonoscope was                            introduced through the anus and advanced to the the                            cecum, identified by appendiceal orifice and                            ileocecal valve. The ileocecal valve, appendiceal                            orifice, and rectum were photographed. The quality                            of the bowel preparation was excellent. The                            colonoscopy was performed without difficulty. The                            patient tolerated the procedure well. Scope In: 11:20:02 AM Scope Out: 11:40:26 AM Scope Withdrawal Time: 0 hours 16 minutes 37 seconds  Total Procedure Duration: 0 hours 20 minutes 24 seconds  Findings:      The perianal and digital rectal examinations were normal.      Two sessile polyps were found in the transverse colon. The  polyps were 5       to 6 mm in size. These polyps were removed with a cold snare. Resection       and retrieval were complete.      Multiple medium-mouthed and small-mouthed diverticula were found in the       left colon. There was no evidence of diverticular bleeding.      Internal hemorrhoids were found during retroflexion. The hemorrhoids       were small and Grade I (internal hemorrhoids that do not prolapse).      The exam was otherwise without abnormality on direct and retroflexion       views. Impression:               - Two 5 to 6 mm polyps in the transverse colon,                            removed with a cold snare. Resected and retrieved.                           - Mild diverticulosis in the left colon.                            - Internal hemorrhoids.                           - The examination was otherwise normal on direct                            and retroflexion views. Moderate Sedation:      Not Applicable - Patient had care per Anesthesia. Recommendation:           - Repeat colonoscopy vs no repeat due to age after                            studies are complete for surveillance based on                            pathology results.                           - Patient has a contact number available for                            emergencies. The signs and symptoms of potential                            delayed complications were discussed with the                            patient. Return to normal activities tomorrow.                            Written discharge instructions were provided to the                            patient.                           -  Resume previous diet with high fiber added.                           - Continue present medications.                           - Await pathology results. Procedure Code(s):        --- Professional ---                           [phone], Colonoscopy, flexible; with removal of                            tumor(s), polyp(s), or other lesion(s) by snare                            technique Diagnosis Code(s):        --- Professional ---                           Z86.010, Personal history of colonic polyps                           D12.3, Benign neoplasm of transverse colon (hepatic                            flexure or splenic flexure)                           K64.0, First degree hemorrhoids                           K57.30, Diverticulosis of large intestine without                            perforation or abscess without bleeding CPT copyright 2022 [employer]. All rights reserved. The codes documented in this report are preliminary and upon coder review may  be revised to meet current compliance requirements. Meryl Dare,  MD 06/20/2023 11:46:19 AM This report has been signed electronically. Number of Addenda: 0

## 2023-06-20 NOTE — Anesthesia Procedure Notes (Signed)
Procedure Name: MAC Date/Time: 06/20/2023 11:14 AM  Performed by: Elyn Peers, CRNAPre-anesthesia Checklist: Patient identified, Emergency Drugs available, Suction available, Patient being monitored and Timeout performed Oxygen Delivery Method: Simple face mask Placement Confirmation: positive ETCO2

## 2023-06-20 NOTE — Discharge Instructions (Signed)

## 2023-06-20 NOTE — Interval H&P Note (Signed)
History and Physical Interval Note:  06/20/2023 11:03 AM  April [name]  has presented today for surgery, with the diagnosis of hx of colon polyps.  The various methods of treatment have been discussed with the patient and family. After consideration of risks, benefits and other options for treatment, the patient has consented to  Procedure(s): COLONOSCOPY WITH PROPOFOL (N/A) as a surgical intervention.  The patient's history has been reviewed, patient examined, no change in status, stable for surgery.  I have reviewed the patient's chart and labs.  Questions were answered to the patient's satisfaction.     Venita Lick. Russella Dar

## 2023-06-23 ENCOUNTER — Encounter (HOSPITAL_COMMUNITY): Payer: Self-pay | Admitting: Gastroenterology

## 2023-06-26 DIAGNOSIS — M25579 Pain in unspecified ankle and joints of unspecified foot: Secondary | ICD-10-CM | POA: Diagnosis not present

## 2023-06-26 DIAGNOSIS — M25519 Pain in unspecified shoulder: Secondary | ICD-10-CM | POA: Diagnosis not present

## 2023-06-26 LAB — SURGICAL PATHOLOGY

## 2023-06-27 ENCOUNTER — Encounter: Payer: Self-pay | Admitting: Gastroenterology

## 2023-07-05 DIAGNOSIS — Z23 Encounter for immunization: Secondary | ICD-10-CM | POA: Diagnosis not present

## 2023-07-11 DIAGNOSIS — E063 Autoimmune thyroiditis: Secondary | ICD-10-CM | POA: Diagnosis not present

## 2023-07-11 DIAGNOSIS — E039 Hypothyroidism, unspecified: Secondary | ICD-10-CM | POA: Diagnosis not present

## 2023-08-10 DIAGNOSIS — Z23 Encounter for immunization: Secondary | ICD-10-CM | POA: Diagnosis not present

## 2023-09-11 DIAGNOSIS — J189 Pneumonia, unspecified organism: Secondary | ICD-10-CM | POA: Diagnosis not present

## 2023-09-11 DIAGNOSIS — R059 Cough, unspecified: Secondary | ICD-10-CM | POA: Diagnosis not present

## 2023-09-16 DIAGNOSIS — J209 Acute bronchitis, unspecified: Secondary | ICD-10-CM | POA: Diagnosis not present

## 2023-09-16 DIAGNOSIS — R062 Wheezing: Secondary | ICD-10-CM | POA: Diagnosis not present

## 2023-09-16 DIAGNOSIS — J189 Pneumonia, unspecified organism: Secondary | ICD-10-CM | POA: Diagnosis not present

## 2023-09-24 DIAGNOSIS — H5213 Myopia, bilateral: Secondary | ICD-10-CM | POA: Diagnosis not present

## 2023-09-24 DIAGNOSIS — H2513 Age-related nuclear cataract, bilateral: Secondary | ICD-10-CM | POA: Diagnosis not present

## 2023-09-24 DIAGNOSIS — H353131 Nonexudative age-related macular degeneration, bilateral, early dry stage: Secondary | ICD-10-CM | POA: Diagnosis not present

## 2023-09-26 DIAGNOSIS — E039 Hypothyroidism, unspecified: Secondary | ICD-10-CM | POA: Diagnosis not present

## 2023-09-26 DIAGNOSIS — E063 Autoimmune thyroiditis: Secondary | ICD-10-CM | POA: Diagnosis not present

## 2023-10-17 DIAGNOSIS — H25812 Combined forms of age-related cataract, left eye: Secondary | ICD-10-CM | POA: Diagnosis not present

## 2023-10-17 DIAGNOSIS — Z961 Presence of intraocular lens: Secondary | ICD-10-CM | POA: Diagnosis not present

## 2023-10-17 DIAGNOSIS — H2512 Age-related nuclear cataract, left eye: Secondary | ICD-10-CM | POA: Diagnosis not present

## 2023-11-04 ENCOUNTER — Telehealth (INDEPENDENT_AMBULATORY_CARE_PROVIDER_SITE_OTHER): Payer: Self-pay | Admitting: Otolaryngology

## 2023-11-04 NOTE — Telephone Encounter (Signed)
 Confirmed appts and address with patient for 11/05/2023.

## 2023-11-05 ENCOUNTER — Ambulatory Visit (INDEPENDENT_AMBULATORY_CARE_PROVIDER_SITE_OTHER): Payer: Medicare Other | Admitting: Audiology

## 2023-11-05 ENCOUNTER — Ambulatory Visit (INDEPENDENT_AMBULATORY_CARE_PROVIDER_SITE_OTHER): Payer: Medicare Other | Admitting: Otolaryngology

## 2023-11-05 VITALS — BP 121/72 | HR 70 | Ht 63.0 in | Wt 145.0 lb

## 2023-11-05 DIAGNOSIS — H6123 Impacted cerumen, bilateral: Secondary | ICD-10-CM | POA: Insufficient documentation

## 2023-11-05 NOTE — Progress Notes (Signed)
 Follow-up: Recurrent cerumen impaction  Procedure: Bilateral cerumen disimpaction.    Indication: Cerumen impaction, resulting in ear discomfort and conductive hearing loss.    Description: The patient is placed supine on the exam table.  Under the operating microscope, the right ear canal is examined and is noted to be completely impacted with cerumen.  The cerumen is carefully removed with a combination of suction catheters, cerumen curette, and alligator forceps.  After the cerumen removal, the ear canal and tympanic membrane are noted to be normal.  No middle ear effusion is noted.  The same procedure is then repeated on the left side without exception. The patient tolerated the procedure well.  Follow-up care:  The patient is instructed not to use Q-tips to clean the ear canals.  The patient will follow up in 4 months.

## 2023-11-28 DIAGNOSIS — E063 Autoimmune thyroiditis: Secondary | ICD-10-CM | POA: Diagnosis not present

## 2023-11-28 DIAGNOSIS — E039 Hypothyroidism, unspecified: Secondary | ICD-10-CM | POA: Diagnosis not present

## 2024-03-05 ENCOUNTER — Ambulatory Visit (INDEPENDENT_AMBULATORY_CARE_PROVIDER_SITE_OTHER): Payer: Medicare Other | Admitting: Otolaryngology

## 2024-03-05 ENCOUNTER — Encounter (INDEPENDENT_AMBULATORY_CARE_PROVIDER_SITE_OTHER): Payer: Self-pay | Admitting: Otolaryngology

## 2024-03-05 VITALS — BP 102/68 | HR 60

## 2024-03-05 DIAGNOSIS — H903 Sensorineural hearing loss, bilateral: Secondary | ICD-10-CM

## 2024-03-05 DIAGNOSIS — H6123 Impacted cerumen, bilateral: Secondary | ICD-10-CM | POA: Diagnosis not present

## 2024-03-05 NOTE — Progress Notes (Signed)
 Patient ID: April [name], female   DOB: 02/02/1951, 73 y.o.   MRN: [id number]  Follow-up: Asymmetric hearing loss  HPI: The patient is a 73 year old female who returns today for her follow-up evaluation.  The patient was previously seen for bilateral hearing loss.  She was noted to have right greater than left sensorineural hearing loss.  Her MRI scan was negative for retrocochlear lesion.  She also has a history of recurrent cerumen impaction.  She has never worn hearing aids.  The patient returns today reporting no significant change in her hearing.  She has noted increasing clogging sensation in the ears.  Exam: General: Communicates without difficulty, well nourished, no acute distress. Head: Normocephalic, no evidence injury, no tenderness, facial buttresses intact without stepoff. Face/sinus: No tenderness to palpation and percussion. Facial movement is normal and symmetric. Eyes: PERRL, EOMI. No scleral icterus, conjunctivae clear. Neuro: CN II exam reveals vision grossly intact.  No nystagmus at any point of gaze. Ears: Auricles well formed without lesions.  Bilateral cerumen impaction.  Nose: External evaluation reveals normal support and skin without lesions.  Dorsum is intact.  Anterior rhinoscopy reveals congested mucosa over anterior aspect of inferior turbinates and intact septum.  No purulence noted. Oral:  Oral cavity and oropharynx are intact, symmetric, without erythema or edema.  Mucosa is moist without lesions. Neck: Full range of motion without pain.  There is no significant lymphadenopathy.  No masses palpable.  Thyroid  bed within normal limits to palpation.  Parotid glands and submandibular glands equal bilaterally without mass.  Trachea is midline. Neuro:  CN 2-12 grossly intact.   Procedure: Bilateral cerumen disimpaction Anesthesia: None Description: Under the operating microscope, the cerumen is carefully removed with a combination of cerumen currette, alligator forceps, and  suction catheters.  After the cerumen is removed, the TMs are noted to be normal.  No mass, erythema, or lesions. The patient tolerated the procedure well.    Assessment: 1.  Bilateral recurrent cerumen impaction.  After the disimpaction procedure, both tympanic membranes and middle ear spaces are noted to be normal. 2.  Subjectively stable bilateral sensorineural hearing loss, worse on the right side.  Her previous MRI scan was negative for retrocochlear lesion.  Plan: 1.  Otomicroscopy with bilateral cerumen disimpaction. 2.  The physical exam findings are reviewed with the patient. 3.  The patient is a candidate for hearing amplification.  She is interested in obtaining her hearing aids from [employer]. 4.  The patient will return for reevaluation in 4 months.

## 2024-03-16 DIAGNOSIS — E039 Hypothyroidism, unspecified: Secondary | ICD-10-CM | POA: Diagnosis not present

## 2024-03-16 DIAGNOSIS — Z1231 Encounter for screening mammogram for malignant neoplasm of breast: Secondary | ICD-10-CM | POA: Diagnosis not present

## 2024-03-16 DIAGNOSIS — Z01419 Encounter for gynecological examination (general) (routine) without abnormal findings: Secondary | ICD-10-CM | POA: Diagnosis not present

## 2024-03-19 DIAGNOSIS — H353131 Nonexudative age-related macular degeneration, bilateral, early dry stage: Secondary | ICD-10-CM | POA: Diagnosis not present

## 2024-03-19 DIAGNOSIS — H2511 Age-related nuclear cataract, right eye: Secondary | ICD-10-CM | POA: Diagnosis not present

## 2024-04-23 DIAGNOSIS — R5381 Other malaise: Secondary | ICD-10-CM | POA: Diagnosis not present

## 2024-04-23 DIAGNOSIS — U071 COVID-19: Secondary | ICD-10-CM | POA: Diagnosis not present

## 2024-04-23 DIAGNOSIS — R051 Acute cough: Secondary | ICD-10-CM | POA: Diagnosis not present

## 2024-04-23 DIAGNOSIS — R5383 Other fatigue: Secondary | ICD-10-CM | POA: Diagnosis not present

## 2024-04-23 DIAGNOSIS — Z03818 Encounter for observation for suspected exposure to other biological agents ruled out: Secondary | ICD-10-CM | POA: Diagnosis not present

## 2024-05-18 DIAGNOSIS — E063 Autoimmune thyroiditis: Secondary | ICD-10-CM | POA: Diagnosis not present

## 2024-05-18 DIAGNOSIS — R946 Abnormal results of thyroid function studies: Secondary | ICD-10-CM | POA: Diagnosis not present

## 2024-05-18 DIAGNOSIS — E782 Mixed hyperlipidemia: Secondary | ICD-10-CM | POA: Diagnosis not present

## 2024-05-18 DIAGNOSIS — E039 Hypothyroidism, unspecified: Secondary | ICD-10-CM | POA: Diagnosis not present

## 2024-05-18 DIAGNOSIS — E7841 Elevated Lipoprotein(a): Secondary | ICD-10-CM | POA: Diagnosis not present

## 2024-05-18 DIAGNOSIS — D6489 Other specified anemias: Secondary | ICD-10-CM | POA: Diagnosis not present

## 2024-05-18 DIAGNOSIS — R799 Abnormal finding of blood chemistry, unspecified: Secondary | ICD-10-CM | POA: Diagnosis not present

## 2024-05-18 DIAGNOSIS — Z79890 Hormone replacement therapy (postmenopausal): Secondary | ICD-10-CM | POA: Diagnosis not present

## 2024-05-18 DIAGNOSIS — E559 Vitamin D deficiency, unspecified: Secondary | ICD-10-CM | POA: Diagnosis not present

## 2024-05-18 DIAGNOSIS — R7301 Impaired fasting glucose: Secondary | ICD-10-CM | POA: Diagnosis not present

## 2024-05-18 DIAGNOSIS — R947 Abnormal results of other endocrine function studies: Secondary | ICD-10-CM | POA: Diagnosis not present

## 2024-05-18 DIAGNOSIS — R5383 Other fatigue: Secondary | ICD-10-CM | POA: Diagnosis not present

## 2024-05-29 DIAGNOSIS — E039 Hypothyroidism, unspecified: Secondary | ICD-10-CM | POA: Diagnosis not present

## 2024-05-29 DIAGNOSIS — H6123 Impacted cerumen, bilateral: Secondary | ICD-10-CM | POA: Diagnosis not present

## 2024-05-29 DIAGNOSIS — E785 Hyperlipidemia, unspecified: Secondary | ICD-10-CM | POA: Diagnosis not present

## 2024-05-29 DIAGNOSIS — Z0000 Encounter for general adult medical examination without abnormal findings: Secondary | ICD-10-CM | POA: Diagnosis not present

## 2024-05-29 DIAGNOSIS — M8589 Other specified disorders of bone density and structure, multiple sites: Secondary | ICD-10-CM | POA: Diagnosis not present

## 2024-05-29 DIAGNOSIS — Z1331 Encounter for screening for depression: Secondary | ICD-10-CM | POA: Diagnosis not present

## 2024-06-18 DIAGNOSIS — E039 Hypothyroidism, unspecified: Secondary | ICD-10-CM | POA: Diagnosis not present

## 2024-06-25 DIAGNOSIS — Z23 Encounter for immunization: Secondary | ICD-10-CM | POA: Diagnosis not present

## 2024-07-06 ENCOUNTER — Ambulatory Visit (INDEPENDENT_AMBULATORY_CARE_PROVIDER_SITE_OTHER): Admitting: Otolaryngology

## 2024-07-19 DIAGNOSIS — Z23 Encounter for immunization: Secondary | ICD-10-CM | POA: Diagnosis not present

## 2024-08-10 ENCOUNTER — Ambulatory Visit (INDEPENDENT_AMBULATORY_CARE_PROVIDER_SITE_OTHER): Admitting: Otolaryngology

## 2024-08-10 ENCOUNTER — Encounter (INDEPENDENT_AMBULATORY_CARE_PROVIDER_SITE_OTHER): Payer: Self-pay | Admitting: Otolaryngology

## 2024-08-10 VITALS — BP 107/65 | HR 60 | Temp 97.7°F | Ht 63.0 in | Wt 152.0 lb

## 2024-08-10 DIAGNOSIS — H6123 Impacted cerumen, bilateral: Secondary | ICD-10-CM | POA: Diagnosis not present

## 2024-08-10 NOTE — Progress Notes (Signed)
 Patient ID: PARRIE RASCO, female   DOB: 1951-08-13, 73 y.o.   MRN: [id number]  Procedure: Bilateral cerumen disimpaction.   Indication: Cerumen impaction, resulting in ear discomfort and conductive hearing loss.   Description: The patient is placed supine on the operating table. Under the operating microscope, the right ear canal is examined and is noted to be impacted with cerumen. The cerumen is carefully removed with a combination of suction catheters, cerumen curette, and alligator forceps. After the cerumen removal, the ear canal and tympanic membrane are noted to be normal. No middle ear effusion is noted. The same procedure is then repeated on the left side without exception. The patient tolerated the procedure well.  Follow-up care:  The patient is instructed not to use Q-tips to clean the ear canals. The patient will follow up in 4 months.

## 2024-08-14 DIAGNOSIS — E039 Hypothyroidism, unspecified: Secondary | ICD-10-CM | POA: Diagnosis not present

## 2024-12-14 ENCOUNTER — Ambulatory Visit (INDEPENDENT_AMBULATORY_CARE_PROVIDER_SITE_OTHER): Admitting: Otolaryngology
